# Patient Record
Sex: Male | Born: 1990 | Race: Black or African American | Hispanic: No | Marital: Single | Smoking: Never smoker
Health system: Southern US, Community
[De-identification: ages and names within clinical notes are randomized; demographics above are authoritative.]

## PROBLEM LIST (undated history)

## (undated) DIAGNOSIS — I10 Essential (primary) hypertension: Secondary | ICD-10-CM

---

## 2022-02-05 ENCOUNTER — Emergency Department (HOSPITAL_BASED_OUTPATIENT_CLINIC_OR_DEPARTMENT_OTHER)
Admission: EM | Admit: 2022-02-05 | Discharge: 2022-02-05 | Disposition: A | Discharge status: Home / Self Care | Payer: No Typology Code available for payment source | Attending: Emergency Medicine | Admitting: Emergency Medicine

## 2022-02-05 ENCOUNTER — Emergency Department (HOSPITAL_BASED_OUTPATIENT_CLINIC_OR_DEPARTMENT_OTHER): Payer: No Typology Code available for payment source

## 2022-02-05 ENCOUNTER — Other Ambulatory Visit: Payer: Self-pay

## 2022-02-05 ENCOUNTER — Encounter (HOSPITAL_BASED_OUTPATIENT_CLINIC_OR_DEPARTMENT_OTHER): Payer: Self-pay | Admitting: *Deleted

## 2022-02-05 DIAGNOSIS — R0602 Shortness of breath: Secondary | ICD-10-CM | POA: Insufficient documentation

## 2022-02-05 DIAGNOSIS — Z79899 Other long term (current) drug therapy: Secondary | ICD-10-CM | POA: Insufficient documentation

## 2022-02-05 DIAGNOSIS — R0789 Other chest pain: Secondary | ICD-10-CM | POA: Diagnosis not present

## 2022-02-05 DIAGNOSIS — R61 Generalized hyperhidrosis: Secondary | ICD-10-CM | POA: Diagnosis not present

## 2022-02-05 DIAGNOSIS — I10 Essential (primary) hypertension: Secondary | ICD-10-CM | POA: Diagnosis not present

## 2022-02-05 DIAGNOSIS — R001 Bradycardia, unspecified: Secondary | ICD-10-CM | POA: Insufficient documentation

## 2022-02-05 DIAGNOSIS — R079 Chest pain, unspecified: Secondary | ICD-10-CM

## 2022-02-05 HISTORY — DX: Essential (primary) hypertension: I10

## 2022-02-05 LAB — CBC
HCT: 39.5 % (ref 39.0–52.0)
Hemoglobin: 13.5 g/dL (ref 13.0–17.0)
MCH: 28.4 pg (ref 26.0–34.0)
MCHC: 34.2 g/dL (ref 30.0–36.0)
MCV: 83.2 fL (ref 80.0–100.0)
Platelets: 268 10*3/uL (ref 150–400)
RBC: 4.75 MIL/uL (ref 4.22–5.81)
RDW: 13.2 % (ref 11.5–15.5)
WBC: 4.5 10*3/uL (ref 4.0–10.5)
nRBC: 0 % (ref 0.0–0.2)

## 2022-02-05 LAB — RAPID URINE DRUG SCREEN, HOSP PERFORMED
Amphetamines: NOT DETECTED
Barbiturates: NOT DETECTED
Benzodiazepines: NOT DETECTED
Cocaine: NOT DETECTED
Opiates: NOT DETECTED
Tetrahydrocannabinol: POSITIVE — AB

## 2022-02-05 LAB — BASIC METABOLIC PANEL
Anion gap: 8 (ref 5–15)
BUN: 11 mg/dL (ref 6–20)
CO2: 23 mmol/L (ref 22–32)
Calcium: 9.5 mg/dL (ref 8.9–10.3)
Chloride: 107 mmol/L (ref 98–111)
Creatinine, Ser: 0.94 mg/dL (ref 0.61–1.24)
GFR, Estimated: >60 mL/min (ref >60)
Glucose, Bld: 94 mg/dL (ref 70–99)
Potassium: 3.8 mmol/L (ref 3.5–5.1)
Sodium: 138 mmol/L (ref 135–145)

## 2022-02-05 LAB — TROPONIN I (HIGH SENSITIVITY)
Troponin I (High Sensitivity): 3 ng/L (ref <18)
Troponin I (High Sensitivity): 2 ng/L (ref <18)

## 2022-02-05 MED ORDER — ASPIRIN 81 MG PO CHEW
324.0000 mg | CHEWABLE_TABLET | Freq: Once | ORAL | Status: AC
  Administered 2022-02-05: 324 mg via ORAL
  Filled 2022-02-05: qty 4

## 2022-02-05 NOTE — ED Provider Notes (Signed)
?[facility] EMERGENCY DEPARTMENT ?Provider Note ? ? ?CSN: [id number] ?Arrival date & time: 02/05/22  1341 ? ?  ? ?History ? ?Chief Complaint  ?Patient presents with  ? Chest Pain  ? ? ?Jacob Contreras is a 31 y.o. male past medical history of hypertension (not on any meds for same) who presents to the ED today with complaint of sudden onset, constant, substernal, tightness that began earlier today prior to 7 AM.  Patient also complains of diaphoresis and shortness of breath.  He reports that the pain wraps around his left shoulder and goes into his back.  He denies any nausea or vomiting.  He denies any cardiac history.  He does not vape.  He denies any significant family history of CAD.  Has not tried anything for pain.  ? ?The history is provided by the patient and medical records.  ? ?  ? ?Home Medications ?Prior to Admission medications   ?Not on File  ?   ? ?Allergies    ?Patient has no known allergies.   ? ?Review of Systems   ?Review of Systems  ?Constitutional:  Positive for diaphoresis. Negative for chills and fever.  ?Respiratory:  Positive for shortness of breath. Negative for cough.   ?Cardiovascular:  Positive for chest pain. Negative for palpitations and leg swelling.  ?Gastrointestinal:  Negative for nausea and vomiting.  ?All other systems reviewed and are negative. ? ?Physical Exam ?Updated Vital Signs ?BP 114/76   Pulse (!) 51   Temp 98.7 ?F (37.1 ?C) (Oral)   Resp 18   Ht 5\' 9"  (1.753 m)   Wt 87.1 kg   SpO2 97%   BMI 28.35 kg/m?  ?Physical Exam ?Vitals and nursing note reviewed.  ?Constitutional:   ?   Appearance: He is not ill-appearing or diaphoretic.  ?HENT:  ?   Head: Normocephalic and atraumatic.  ?Eyes:  ?   Conjunctiva/sclera: Conjunctivae normal.  ?Cardiovascular:  ?   Rate and Rhythm: Normal rate and regular rhythm.  ?   Pulses:     ?     Radial pulses are 2+ on the right side and 2+ on the left side.  ?     Posterior tibial pulses are 2+ on the right side and 2+ on the left  side.  ?   Heart sounds: Normal heart sounds.  ?Pulmonary:  ?   Effort: Pulmonary effort is normal.  ?   Breath sounds: Normal breath sounds. No decreased breath sounds, wheezing, rhonchi or rales.  ?Chest:  ?   Chest wall: Tenderness present.  ?Abdominal:  ?   Palpations: Abdomen is soft.  ?   Tenderness: There is no abdominal tenderness.  ?Musculoskeletal:  ?   Cervical back: Neck supple.  ?   Right lower leg: No edema.  ?   Left lower leg: No edema.  ?Skin: ?   General: Skin is warm and dry.  ?Neurological:  ?   Mental Status: He is alert.  ? ? ?ED Results / Procedures / Treatments   ?Labs ?(all labs ordered are listed, but only abnormal results are displayed) ?Labs Reviewed  ?RAPID URINE DRUG SCREEN, HOSP PERFORMED - Abnormal; Notable for the following components:  ?    Result Value  ? Tetrahydrocannabinol POSITIVE (*)   ? All other components within normal limits  ?BASIC METABOLIC PANEL  ?CBC  ?TROPONIN I (HIGH SENSITIVITY)  ?TROPONIN I (HIGH SENSITIVITY)  ? ? ?EKG ?EKG Interpretation ? ?Date/Time:  Friday February 05 2022 13:49:40  EDT ?Ventricular Rate:  53 ?PR Interval:  193 ?QRS Duration: 84 ?QT Interval:  401 ?QTC Calculation: 377 ?R Axis:   42 ?Text Interpretation: Sinus rhythm Confirmed by Nanda Quinton [phone]) on 02/05/2022 2:33:38 PM ? ?Radiology ?DG Chest 2 View ? ?Result Date: 02/05/2022 ?CLINICAL DATA:  Provided history: Chest pain. EXAM: CHEST - 2 VIEW COMPARISON:  None. FINDINGS: Heart size within normal limits. No appreciable airspace consolidation. No evidence of pleural effusion or pneumothorax. No acute bony abnormality identified. IMPRESSION: No evidence of active cardiopulmonary disease. Electronically Signed   By: Kellie Simmering D.O.   On: 02/05/2022 14:21   ? ?Procedures ?Procedures  ? ? ?Medications Ordered in ED ?Medications  ?aspirin chewable tablet 324 mg (324 mg Oral Given 02/05/22 1409)  ? ? ?ED Course/ Medical Decision Making/ A&P ?  ?                        ?Medical Decision  Making ?31 year old male who presents to the ED today with complaint of substernal chest tightness with radiation to back with associated diaphoresis and shortness of breath that began earlier this morning.  On arrival to the ED patient is bradycardic with heart rates in the 50s.  No documented history of same.  He does have a history of hypertension however is not on medications and states he has never been on medications for same.  Blood pressure 137/84 on arrival.  Remainder vitals are unremarkable. EKG without acute ischemic changes; no previous to compare to.  We will plan to work-up for ACS at this time with CBC, BMP, troponin, chest x-ray.  Patient has equal pulses bilaterally.  Very low suspicion for dissection.  He denies any URI-like symptoms prior to chest pain beginning to suggest pericarditis versus myocarditis.  Given he is nontachycardic and nonhypoxic I have very low suspicion for PE at this time.  We will add on UDS at this time to rule out cocaine use.  Aspirin provided. ? ?CXR clear ?Initial troponin of 3. Will plan to repeat ?CBC without leukocytosis. Hgb stable ?BMP without electrolyte abnormalities ?UDS positive for THC ? ?Repeat troponin downtrending at 2. Very low suspicion for ACS given downtrending troponins that are very close to negative. On repeat exam pt resting comfortably. He has continued to be bradycardic in the ED today. Does not appear symptomatic from same. He is unsure of hx of same - unable to see previous heart rates in our system. He is advised to follow up with the [facility] in [place]. Pt in agreement with plan and stable for discharge home.  ? ?Amount and/or Complexity of Data Reviewed ?Labs: ordered. Decision-making details documented in ED Course. ?Radiology: ordered. ? ?Risk ?OTC drugs. ? ? ? ? ? ? ? ? ? ?Final Clinical Impression(s) / ED Diagnoses ?Final diagnoses:  ?Nonspecific chest pain  ?Bradycardia  ? ? ?Rx / DC Orders ?ED Discharge Orders   ? ? None  ? ?   ? ? ? ?Discharge Instructions   ? ?  ?Your workup was overall reassuring in the ED today. Please follow up with the [facility] regarding your ED visit today and your low resting heart rate.  ? ?It is recommended that you take Ibuprofen and Tylenol as needed for pain.  ? ?Return to the ED for any new/worsening symptoms ? ? ? ? ? ?  ?Eustaquio Maize, PA-C ?02/05/22 1659 ? ?  ?Margette Fast, MD ?02/18/22 1116 ? ?

## 2022-02-05 NOTE — ED Notes (Signed)
ED Provider at bedside. 

## 2022-02-05 NOTE — ED Notes (Signed)
Patient transported to X-ray 

## 2022-02-05 NOTE — ED Triage Notes (Signed)
Pt reports he woke up with chest pain this am around 7am. Pt states chest is "tight" and he got sweaty when it started, and it goes to his back ?

## 2022-02-05 NOTE — Discharge Instructions (Signed)
Your workup was overall reassuring in the ED today. [facility] and your low resting heart rate.  ? ?It is recommended that you take Ibuprofen and Tylenol as needed for pain.  ? ?Return to the ED for any new/worsening symptoms ?

## 2022-12-17 ENCOUNTER — Encounter (HOSPITAL_COMMUNITY): Payer: Self-pay

## 2022-12-17 ENCOUNTER — Emergency Department (HOSPITAL_COMMUNITY)
Admission: EM | Admit: 2022-12-17 | Discharge: 2022-12-18 | Disposition: A | Discharge status: Other Acute Inpatient Hospital | Payer: Non-veteran care | Attending: Emergency Medicine | Admitting: Emergency Medicine

## 2022-12-17 ENCOUNTER — Other Ambulatory Visit: Payer: Self-pay

## 2022-12-17 DIAGNOSIS — I10 Essential (primary) hypertension: Secondary | ICD-10-CM | POA: Insufficient documentation

## 2022-12-17 DIAGNOSIS — T1491XA Suicide attempt, initial encounter: Secondary | ICD-10-CM

## 2022-12-17 DIAGNOSIS — Z20822 Contact with and (suspected) exposure to covid-19: Secondary | ICD-10-CM | POA: Diagnosis not present

## 2022-12-17 DIAGNOSIS — F4321 Adjustment disorder with depressed mood: Secondary | ICD-10-CM | POA: Insufficient documentation

## 2022-12-17 DIAGNOSIS — T50901A Poisoning by unspecified drugs, medicaments and biological substances, accidental (unintentional), initial encounter: Secondary | ICD-10-CM | POA: Insufficient documentation

## 2022-12-17 DIAGNOSIS — X838XXA Intentional self-harm by other specified means, initial encounter: Secondary | ICD-10-CM | POA: Diagnosis not present

## 2022-12-17 DIAGNOSIS — T43592A Poisoning by other antipsychotics and neuroleptics, intentional self-harm, initial encounter: Secondary | ICD-10-CM | POA: Insufficient documentation

## 2022-12-17 DIAGNOSIS — T50902A Poisoning by unspecified drugs, medicaments and biological substances, intentional self-harm, initial encounter: Secondary | ICD-10-CM

## 2022-12-17 LAB — CBC WITH DIFFERENTIAL/PLATELET
Abs Immature Granulocytes: 0.02 10*3/uL (ref 0.00–0.07)
Basophils Absolute: 0 10*3/uL (ref 0.0–0.1)
Basophils Relative: 0 %
Eosinophils Absolute: 0.1 10*3/uL (ref 0.0–0.5)
Eosinophils Relative: 1 %
HCT: 42.7 % (ref 39.0–52.0)
Hemoglobin: 13.8 g/dL (ref 13.0–17.0)
Immature Granulocytes: 0 %
Lymphocytes Relative: 21 %
Lymphs Abs: 1.6 10*3/uL (ref 0.7–4.0)
MCH: 28 pg (ref 26.0–34.0)
MCHC: 32.3 g/dL (ref 30.0–36.0)
MCV: 86.8 fL (ref 80.0–100.0)
Monocytes Absolute: 0.6 10*3/uL (ref 0.1–1.0)
Monocytes Relative: 8 %
Neutro Abs: 5.1 10*3/uL (ref 1.7–7.7)
Neutrophils Relative %: 70 %
Platelets: 316 10*3/uL (ref 150–400)
RBC: 4.92 MIL/uL (ref 4.22–5.81)
RDW: 13.2 % (ref 11.5–15.5)
WBC: 7.4 10*3/uL (ref 4.0–10.5)
nRBC: 0 % (ref 0.0–0.2)

## 2022-12-17 LAB — COMPREHENSIVE METABOLIC PANEL
ALT: 37 U/L (ref 0–44)
AST: 25 U/L (ref 15–41)
Albumin: 4.3 g/dL (ref 3.5–5.0)
Alkaline Phosphatase: 48 U/L (ref 38–126)
Anion gap: 9 (ref 5–15)
BUN: 9 mg/dL (ref 6–20)
CO2: 23 mmol/L (ref 22–32)
Calcium: 9.4 mg/dL (ref 8.9–10.3)
Chloride: 105 mmol/L (ref 98–111)
Creatinine, Ser: 0.98 mg/dL (ref 0.61–1.24)
GFR, Estimated: >60 mL/min (ref >60)
Glucose, Bld: 107 mg/dL — ABNORMAL HIGH (ref 70–99)
Potassium: 4.6 mmol/L (ref 3.5–5.1)
Sodium: 137 mmol/L (ref 135–145)
Total Bilirubin: 0.6 mg/dL (ref 0.3–1.2)
Total Protein: 7.6 g/dL (ref 6.5–8.1)

## 2022-12-17 LAB — RAPID URINE DRUG SCREEN, HOSP PERFORMED
Amphetamines: NOT DETECTED
Barbiturates: NOT DETECTED
Benzodiazepines: NOT DETECTED
Cocaine: NOT DETECTED
Opiates: NOT DETECTED
Tetrahydrocannabinol: NOT DETECTED

## 2022-12-17 LAB — RESP PANEL BY RT-PCR (RSV, FLU A&B, COVID)  RVPGX2
Influenza A by PCR: NEGATIVE
Influenza B by PCR: NEGATIVE
Resp Syncytial Virus by PCR: NEGATIVE
SARS Coronavirus 2 by RT PCR: NEGATIVE

## 2022-12-17 LAB — ETHANOL: Alcohol, Ethyl (B): <10 mg/dL (ref <10)

## 2022-12-17 LAB — ACETAMINOPHEN LEVEL: Acetaminophen (Tylenol), Serum: <10 ug/mL — ABNORMAL LOW (ref 10–30)

## 2022-12-17 LAB — SALICYLATE LEVEL: Salicylate Lvl: <7 mg/dL — ABNORMAL LOW (ref 7.0–30.0)

## 2022-12-17 MED ORDER — SODIUM CHLORIDE 0.9 % IV BOLUS (SEPSIS)
1000.0000 mL | Freq: Once | INTRAVENOUS | Status: AC
Start: 2022-12-17 — End: 2022-12-17
  Administered 2022-12-17: 1000 mL via INTRAVENOUS

## 2022-12-17 MED ORDER — SODIUM CHLORIDE 0.9 % IV SOLN
1000.0000 mL | INTRAVENOUS | Status: DC
  Administered 2022-12-17: 1000 mL via INTRAVENOUS

## 2022-12-17 MED ORDER — HYDROXYZINE HCL 25 MG PO TABS: 25.0000 mg | ORAL_TABLET | Freq: Three times a day (TID) | ORAL | Status: DC | PRN

## 2022-12-17 NOTE — Progress Notes (Signed)
Patient has been denied by [facility] due to no appropriate beds available. Patient meets [facility] inpatient criteria per Michaele Offer, NP. Patient has been faxed out to the following facilities:   [facility]  [address], [place] [address] [phone] [place]  [address] [place] [address] [phone]  [facility]  [address], [place] [address] [phone]  [facility]  [address] [place] [address] [phone]  [facility]  [place], [place] [address] [phone]  [facility]  [address] [place], [place] [address] [place] [place] [address] [place]  [facility]  [address] [place] [address] [phone]  [facility]  [address], [place]  [address] [phone]  [place] [place] [address] [place]  [facility]  [address] [place] [address] [phone] [place], MSW, LCSW-A  10:01 PM 12/17/2022

## 2022-12-17 NOTE — ED Notes (Signed)
Spoke with Patty from poison control at this time about patient's case.

## 2022-12-17 NOTE — ED Provider Notes (Signed)
[name] Provider Note  CSN: [id number] Arrival date & time: 12/17/22 [id number]  Chief Complaint(s) Drug Overdose ED Triage Notes Jacob Starch, RN (Registered Nurse)  Emergency Medicine  Date of Service: 12/17/2022  2:28 AM  Signed [name], reports patient taking 110 mg of zyprexa at 4pm, normally takes '5mg'$  daily. Per Contreras, girlfriend states he had been okay until recently. Patient is drowsy but does respond to verbal commands.     HPI Jacob Contreras is a 32 y.o. male here after overdosing on estimated '110mg'$  of olanzapine. Possible suicide attempt.   The history is provided by the Contreras personnel, a friend and a significant other.    Past Medical History Past Medical History:  Diagnosis Date   Hypertension    There are no problems to display for this patient.  Home Medication(s) Prior to Admission medications   Not on File                                                                                                                                    Allergies Patient has no known allergies.  Review of Systems Review of Systems As noted in HPI  Physical Exam Vital Signs  I have reviewed the triage vital signs BP 118/69   Pulse 83   Temp 98 F (36.7 C) (Oral)   Resp 15   SpO2 97%   Physical Exam Vitals reviewed.  Constitutional:      General: He is not in acute distress.    Appearance: He is well-developed. He is not diaphoretic.  HENT:     Head: Normocephalic and atraumatic.     Nose: Nose normal.  Eyes:     General: No scleral icterus.       Right eye: No discharge.        Left eye: No discharge.     Conjunctiva/sclera: Conjunctivae normal.     Pupils: Pupils are equal, round, and reactive to light.     Comments: Pin point pupils  Cardiovascular:     Rate and Rhythm: Normal rate and regular rhythm.     Heart sounds: No murmur heard.    No friction rub. No gallop.  Pulmonary:      Effort: Pulmonary effort is normal. No respiratory distress.     Breath sounds: Normal breath sounds. No stridor. No rales.  Abdominal:     General: There is no distension.     Palpations: Abdomen is soft.     Tenderness: There is no abdominal tenderness.  Musculoskeletal:        General: No tenderness.     Cervical back: Normal range of motion and neck supple.  Skin:    General: Skin is warm and dry.     Findings: No erythema or rash.  Neurological:     Mental Status: He is lethargic.  ED Results and Treatments Labs (all labs ordered are listed, but only abnormal results are displayed) Labs Reviewed  COMPREHENSIVE METABOLIC PANEL - Abnormal; Notable for the following components:      Result Value   Glucose, Bld 107 (*)    All other components within normal limits  SALICYLATE LEVEL - Abnormal; Notable for the following components:   Salicylate Lvl [id number] (*)    All other components within normal limits  ACETAMINOPHEN LEVEL - Abnormal; Notable for the following components:   Acetaminophen (Tylenol), Serum <10 (*)    All other components within normal limits  ETHANOL  RAPID URINE DRUG SCREEN, HOSP PERFORMED  CBC WITH DIFFERENTIAL/PLATELET                                                                                                                         EKG  EKG Interpretation  Date/Time:  Friday December 17 2022 06:08:23 EST Ventricular Rate:  61 PR Interval:  190 QRS Duration: 82 QT Interval:  377 QTC Calculation: 380 R Axis:   58 Text Interpretation: Sinus rhythm Consider left atrial enlargement Borderline ST elevation, anterior leads No acute changes Confirmed by Jacob Contreras [phone]) on 12/17/2022 6:33:20 AM       Radiology No results found.  Medications Ordered in ED Medications  sodium chloride 0.9 % bolus 1,000 mL (0 mLs Intravenous Stopped 12/17/22 0352)    Followed by  0.9 %  sodium chloride infusion (1,000 mLs Intravenous New Bag/Given 12/17/22 0548)                                                                                                                                      Procedures Procedures  (including critical care time)  Medical Decision Making / ED Course  Click here for ABCD2, HEART and other calculators  Medical Decision Making Amount and/or Complexity of Data Reviewed Labs: ordered. Decision-making details documented in ED Course. ECG/medicine tests: ordered and independent interpretation performed. Decision-making details documented in ED Course.  Risk Prescription drug management. Decision regarding hospitalization.     Clinical Course as of 12/17/22 0733  Fri Dec 17, 2022  0230 Somnolent. Possible overdose. OD labs ordered. Will monitor. [PC]  [id number] CBC without leukocytosis or anemia.  Metabolic panel without significant electrolyte derangements or renal sufficiency.  EtOH negative.  Acetaminophen and salicylate levels negative.  UDS negative. [PC]  [id number] Patient's significant  other came to the emergency department and stated that the patient's was prescribed 20 mg olanzapine.  Reports that the bottle was written for 15 tablets and there were 8 left.  He was supposed to take half a tablet twice a day.  States that he received a medication sometime last week but is not sure if he was taking it on a daily basis.  When she asked patient how many pills she had took earlier, he repeatedly said " two pills." [PC]  0600 Spoke with poison control. Once patient is awake he would be clear for DC after reassuring repeat EKG.  [PC]  [id number] EKG unchanged Patient still sleepy, but arousable. Will continue to monitor [PC]  0704 Received sign out from Dr. Leonette Monarch, presenting with Zyprexa overdose, persistently sleepy. Not reportedly a suicide attempt but will re-assess [WS]    Clinical Course User Index [PC] Jacob Contreras, Jacob Sessions, MD [WS] Jacob Mainland Livingston Diones, MD   Patient care turned over to oncoming provider. Patient case and  results discussed in detail; please see their note for further ED managment.      Final Clinical Impression(s) / ED Diagnoses Final diagnoses:  None    {Document critical care time when appropriate:1}  {Document review of labs and clinical decision tools ie heart score, Chads2Vasc2 etc:1}  {Document your independent review of radiology images, and any outside records:1} {Document your discussion with family members, caretakers, and with consultants:1} {Document social determinants of health affecting pt's care:1} {Document your decision making why or why not admission, treatments were needed:1} This chart was dictated using voice recognition software.  Despite best efforts to proofread,  errors can occur which can change the documentation meaning.

## 2022-12-17 NOTE — ED Notes (Addendum)
Pt belonging were  behind the nurses station in [place] they have been placed in locker 36.

## 2022-12-17 NOTE — ED Notes (Signed)
Spoke with Lattie Haw at [employer]. Repeat EKG results given and updated vitals as well. Lattie Haw states that [employer] will close his case out on their end. If we need anything else regarding a change in this pt to please call back.

## 2022-12-17 NOTE — Consult Note (Signed)
[facility] ED ASSESSMENT   Reason for Consult:  overdose, SI Referring Physician:  Dr. Truett Mainland  Patient Identification: Jacob Contreras Age MRN:  [id number] ED Chief Complaint: Suicide attempt [facility])  Diagnosis:  Principal Problem:   Suicide attempt [facility]) Active Problems:   Grief   Overdose   ED Assessment Time Calculation: Start Time: 1130 Stop Time: 1210 Total Time in Minutes (Assessment Completion): 40   HPI:  Per Triage Note Patient brought in by [facility], reports patient taking 110 mg of zyprexa at 4pm, normally takes '5mg'$  daily. Per ems, girlfriend states he had been okay until recently. Patient is drowsy but does respond to verbal commands.    Subjective:  Jacob Contreras, 32 y.o., male patient seen face to face by this provider, consulted with Dr. Leverne Humbles; and chart reviewed on 12/17/22.  On evaluation Jacob Contreras reports that this attempt was a suicidal attempt due to a lot going on in his life he says, says he lost his grandmother about 3 weeks ago she passed away, says that he is living with his girlfriend and things are not going well with him, and he said he has no job.  Patient states that his appetite is fair, and his sleep is poor due to racing thoughts and anxiety.  He says that he does not have a lot of family support right now, because everyone is grieving for his grandmother in their own way, says he is very close with his 2 brothers but has not spoken to them in about 2 to 3 weeks since her grandmother passed.  Patient says that he does have a therapist, Dr. Redmond Pulling that he sees but has not spoken to his therapist since his grandmother passed, and he just felt like life has been too hard lately.  Patient currently denies SI/HI/AVH.  Patient states that he has had previous attempts of SI, said that he was in a psychiatric facility in 2023.  Patient states that he takes Vistaril, Zyprexa, and unsure of sleep medication that he takes.  Patient states he has a diagnosis of bipolar disorder,  his UDS and alcohol level was negative.  During evaluation Jacob Contreras is laying in hospital bed in no acute distress.  Patient is alert but drowsy as he is just waking up for the day, oriented x 3, calm, cooperative and attentive.  His mood is depressed with congruent/guarded affect.  He has mumbling speech, decreased volume, and behavior. Objectively there is no evidence of psychosis/mania or delusional thinking. Patient is able to converse coherently, no distractibility, or pre-occupation.  He currently denies suicidal/self-harm/homicidal ideation, psychosis, and paranoia.      Past Psychiatric History: Bipolar disorder  Risk to Self or Others: Is the patient at risk to self? Yes Has the patient been a risk to self in the past 6 months? No Has the patient been a risk to self within the distant past? No Is the patient a risk to others? No Has the patient been a risk to others in the past 6 months? No Has the patient been a risk to others within the distant past? No  [place] Scale:   AIMS:  , , ,  ,   ASAM:    Substance Abuse:     Past Medical History:  Past Medical History:  Diagnosis Date   Hypertension    History reviewed. No pertinent surgical history. Family History: History reviewed. No pertinent family history.  Social History:  Social History   Substance and Sexual Activity  Alcohol  Use Never     Social History   Substance and Sexual Activity  Drug Use Never    Social History   Socioeconomic History   Marital status: Single    Spouse name: Not on file   Number of children: Not on file   Years of education: Not on file   Highest education level: Not on file  Occupational History   Not on file  Tobacco Use   Smoking status: Every Day    Types: Cigarettes   Smokeless tobacco: Never  Substance and Sexual Activity   Alcohol use: Never   Drug use: Never   Sexual activity: Not on file  Other Topics Concern   Not on file  Social History Narrative    Not on file   Social Determinants of Health   Financial Resource Strain: Not on file  Food Insecurity: Not on file  Transportation Needs: Not on file  Physical Activity: Not on file  Stress: Not on file  Social Connections: Not on file   Additional Social History:    Allergies:  No Known Allergies  Labs:  Results for orders placed or performed during the hospital encounter of 12/17/22 (from the past 48 hour(s))  Rapid urine drug screen (hospital performed)     Status: None   Collection Time: 12/17/22  2:20 AM  Result Value Ref Range   Opiates NONE DETECTED NONE DETECTED   Cocaine NONE DETECTED NONE DETECTED   Benzodiazepines NONE DETECTED NONE DETECTED   Amphetamines NONE DETECTED NONE DETECTED   Tetrahydrocannabinol NONE DETECTED NONE DETECTED   Barbiturates NONE DETECTED NONE DETECTED    Comment: (NOTE) DRUG SCREEN FOR MEDICAL PURPOSES ONLY.  IF CONFIRMATION IS NEEDED FOR ANY PURPOSE, NOTIFY LAB WITHIN 5 DAYS.  LOWEST DETECTABLE LIMITS FOR URINE DRUG SCREEN Drug Class                     Cutoff (ng/mL) Amphetamine and metabolites    1000 Barbiturate and metabolites    200 Benzodiazepine                 200 Opiates and metabolites        300 Cocaine and metabolites        300 THC                            50 Performed at [facility], [place] [address], [place], [place] [address]   Comprehensive metabolic panel     Status: Abnormal   Collection Time: 12/17/22  2:38 AM  Result Value Ref Range   Sodium 137 135 - 145 mmol/L   Potassium 4.6 3.5 - 5.1 mmol/L   Chloride 105 98 - 111 mmol/L   CO2 23 22 - 32 mmol/L   Glucose, Bld 107 (H) 70 - 99 mg/dL    Comment: Glucose reference range applies only to samples taken after fasting for at least 8 hours.   BUN 9 6 - 20 mg/dL   Creatinine, Ser 0.98 0.61 - 1.24 mg/dL   Calcium 9.4 8.9 - 10.3 mg/dL   Total Protein 7.6 6.5 - 8.1 g/dL   Albumin 4.3 3.5 - 5.0 g/dL   AST 25 15 - 41 U/L   ALT 37 0 - 44 U/L    Alkaline Phosphatase 48 38 - 126 U/L   Total Bilirubin 0.6 0.3 - 1.2 mg/dL   GFR, Estimated >60 >60 mL/min  Comment: (NOTE) Calculated using the CKD-EPI Creatinine Equation (2021)    Anion gap 9 5 - 15    Comment: Performed at [facility], [place] [address] [place], [place] [address]  Ethanol     Status: None   Collection Time: 12/17/22  2:38 AM  Result Value Ref Range   Alcohol, Ethyl (B) <10 <10 mg/dL    Comment: (NOTE) Lowest detectable limit for serum alcohol is 10 mg/dL.  For medical purposes only. Performed at [facility], [place] [address], [place], [place] [id number]   Salicylate level     Status: Abnormal   Collection Time: 12/17/22  2:38 AM  Result Value Ref Range   Salicylate Lvl [id number] (L) 7.0 - 30.0 mg/dL    Comment: Performed at [facility], [place] [address], [place], [place] [address]  Acetaminophen level     Status: Abnormal   Collection Time: 12/17/22  2:38 AM  Result Value Ref Range   Acetaminophen (Tylenol), Serum <10 (L) 10 - 30 ug/mL    Comment: (NOTE) Therapeutic concentrations vary significantly. A range of 10-30 ug/mL  may be an effective concentration for many patients. However, some  are best treated at concentrations outside of this range. Acetaminophen concentrations >150 ug/mL at 4 hours after ingestion  and >50 ug/mL at 12 hours after ingestion are often associated with  toxic reactions.  Performed at [facility], [place] [address] [place], [place] [address]   CBC with Differential     Status: None   Collection Time: 12/17/22  2:39 AM  Result Value Ref Range   WBC 7.4 4.0 - 10.5 K/uL   RBC 4.92 4.22 - 5.81 MIL/uL   Hemoglobin 13.8 13.0 - 17.0 g/dL   HCT 42.7 39.0 - 52.0 %   MCV 86.8 80.0 - 100.0 fL   MCH 28.0 26.0 - 34.0 pg   MCHC 32.3 30.0 - 36.0 g/dL   RDW 13.2 11.5 - 15.5 %   Platelets 316 150 - 400 K/uL   nRBC 0.0 0.0 - 0.2 %   Neutrophils Relative % 70 %    Neutro Abs 5.1 1.7 - 7.7 K/uL   Lymphocytes Relative 21 %   Lymphs Abs 1.6 0.7 - 4.0 K/uL   Monocytes Relative 8 %   Monocytes Absolute 0.6 0.1 - 1.0 K/uL   Eosinophils Relative 1 %   Eosinophils Absolute 0.1 0.0 - 0.5 K/uL   Basophils Relative 0 %   Basophils Absolute 0.0 0.0 - 0.1 K/uL   Immature Granulocytes 0 %   Abs Immature Granulocytes 0.02 0.00 - 0.07 K/uL    Comment: Performed at [facility], [place] [address], [place], [place] [address]    Current Facility-Administered Medications  Medication Dose Route Frequency Provider Last Rate Last Admin   0.9 %  sodium chloride infusion  1,000 mL Intravenous Continuous Cardama, Grayce Sessions, MD 125 mL/hr at 12/17/22 0548 1,000 mL at 12/17/22 0548   Current Outpatient Medications  Medication Sig Dispense Refill   hydrOXYzine (VISTARIL) 25 MG capsule Take 25 mg by mouth 2 (two) times daily as needed for anxiety.     OLANZapine (ZYPREXA) 20 MG tablet Take 10 mg by mouth at bedtime.      Musculoskeletal: Strength & Muscle Tone: within normal limits Gait & Station: normal Patient leans: N/A   Psychiatric Specialty Exam: Presentation  General Appearance:  Appropriate for Environment  Eye Contact: Fleeting  Speech: Clear and Coherent  Speech  Volume: Decreased  Handedness: Left   Mood and Affect  Mood: Depressed  Affect: Appropriate; Flat   Thought Process  Thought Processes: Coherent  Descriptions of Associations:Intact  Orientation:Full (Time, Place and Person)  Thought Content:WDL  History of Schizophrenia/Schizoaffective disorder:No data recorded Duration of Psychotic Symptoms:No data recorded Hallucinations:Hallucinations: None  Ideas of Reference:None  Suicidal Thoughts:Suicidal Thoughts: No  Homicidal Thoughts:Homicidal Thoughts: No   Sensorium  Memory: Immediate Fair; Recent Fair  Judgment: Poor  Insight: Fair   Manufacturing systems engineer: Fair  Attention Span: Fair  Recall: [place] of Knowledge: Good  Language: Good   Psychomotor Activity  Psychomotor Activity: Psychomotor Activity: Normal   Assets  Assets: Communication Skills; Physical Health; Social Support    Sleep  Sleep: Sleep: Poor   Physical Exam: Physical Exam HENT:     Head: Normocephalic.  Eyes:     Pupils: Pupils are equal, round, and reactive to light.  Abdominal:     General: Abdomen is flat.  Musculoskeletal:        General: Normal range of motion.  Neurological:     Mental Status: He is alert.  Psychiatric:        Attention and Perception: Attention normal.        Mood and Affect: Mood is depressed. Affect is flat.        Speech: Speech is delayed.        Behavior: Behavior is cooperative.        Thought Content: Thought content includes suicidal ideation.        Cognition and Memory: Memory normal.        Judgment: Judgment is impulsive and inappropriate.    Review of Systems  Constitutional: Negative.   HENT: Negative.    Respiratory: Negative.    Musculoskeletal: Negative.   Psychiatric/Behavioral:  Positive for depression and suicidal ideas.    Blood pressure 130/82, pulse (!) 59, temperature 98.2 F (36.8 C), resp. rate 15, SpO2 100 %. There is no height or weight on file to calculate BMI.  Medical Decision Making: Patient will be considered for inpatient Psychiatry care when Medically cleared.  Attempted to speak with girlfriend Rowe Pavy to obtain collateral information, patient unable to remember her phone number. Patient is IVC at this time. Will order Atarax for anxiety. Will consult with SW team for bed availability.     Disposition: Recommend psychiatric Inpatient admission when medically cleared.  Michaele Offer, PMHNP 12/17/2022 12:40 PM

## 2022-12-17 NOTE — ED Provider Notes (Addendum)
    Procedures  .Critical Care  Performed by: Cristie Hem, MD Authorized by: Cristie Hem, MD   Critical care provider statement:    Critical care time (minutes):  30   Critical care time was exclusive of:  Separately billable procedures and treating other patients   Critical care was necessary to treat or prevent imminent or life-threatening deterioration of the following conditions:  Toxidrome   Critical care was time spent personally by me on the following activities:  Development of treatment plan with patient or surrogate, discussions with consultants, evaluation of patient's response to treatment, examination of patient, ordering and review of laboratory studies, ordering and review of radiographic studies, ordering and performing treatments and interventions, pulse oximetry, re-evaluation of patient's condition and review of old charts   ED Course / MDM   Clinical Course as of 12/17/22 1409  Fri Dec 17, 2022  0230 Somnolent. Possible overdose. OD labs ordered. Will monitor. [PC]  [id number] CBC without leukocytosis or anemia.  Metabolic panel without significant electrolyte derangements or renal sufficiency.  EtOH negative.  Acetaminophen and salicylate levels negative.  UDS negative. [PC]  [id number] Patient's significant other came to the emergency department and stated that the patient's was prescribed 20 mg olanzapine.  Reports that the bottle was written for 15 tablets and there were 8 left.  He was supposed to take half a tablet twice a day.  States that he received a medication sometime last week but is not sure if he was taking it on a daily basis.  When she asked patient how many pills she had took earlier, he repeatedly said " two pills." [PC]  0600 Spoke with poison control. Once patient is awake he would be clear for DC after reassuring repeat EKG.  [PC]  [id number] EKG unchanged Patient still sleepy, but arousable. Will continue to monitor [PC]  0704 Received sign out from  Dr. Leonette Monarch, presenting with Zyprexa overdose, persistently sleepy. Not reportedly a suicide attempt but will re-assess [WS]  0906 Patient arousable. Reports he did overdose on Zyprexa as a self harm attempt. Denies active suicidal ideation and willing to speak with psychiatry. Will have sitter at bedside. Will consult TTS [WS]  1233 Psychiatry requesting IVC given suicide attempt. Will place IVC. Patient will be referred for inpatient treatment [WS]  1408 Patient medically cleared per my evaluation and previous discussion with poison control.  [WS]    Clinical Course User Index [PC] Cardama, Grayce Sessions, MD [WS] Truett Mainland Livingston Diones, MD   Medical Decision Making Amount and/or Complexity of Data Reviewed Labs: ordered.  Risk Prescription drug management.         Cristie Hem, MD 12/17/22 1149    Cristie Hem, MD 12/17/22 1209    Cristie Hem, MD 12/17/22 1234    Cristie Hem, MD 12/17/22 1409

## 2022-12-17 NOTE — ED Notes (Signed)
Patient was dress out in burgundy scrubs and grey socks.  Patient was wanded by security.  Patient had 1 black book bag and a multi color bag as while as one patient belonging bag.  I left  patient  belongings with the paramedic in sapu.

## 2022-12-17 NOTE — ED Notes (Signed)
Pt given dinner tray.

## 2022-12-17 NOTE — ED Notes (Signed)
Patient awoke to need to use bathroom for BM. Patient taken to bathroom and back to bed.

## 2022-12-17 NOTE — ED Triage Notes (Signed)
Patient brought in by [facility], reports patient taking 110 mg of zyprexa at 4pm, normally takes '5mg'$  daily. Per ems, girlfriend states he had been okay until recently. Patient is drowsy but does respond to verbal commands.

## 2022-12-17 NOTE — ED Notes (Signed)
Pt provided urinal for use. Pt reports to MD that he overdosed in attempt to harm himself. TTS consult placed by MD

## 2022-12-17 NOTE — ED Notes (Signed)
Patient in room talking to psych provider at this time. Sitter order placed.

## 2022-12-17 NOTE — ED Notes (Signed)
2 patient belonging bookbags in cabinet at nurses station.

## 2022-12-17 NOTE — ED Notes (Signed)
Mother called requesting updates, explained that I can't provide information without his consent. Mother asked patient to call her. Patient made aware

## 2022-12-18 ENCOUNTER — Inpatient Hospital Stay (HOSPITAL_COMMUNITY)
Admission: EM | Admit: 2022-12-18 | Discharge: 2022-12-23 | DRG: 885 | Disposition: A | Discharge status: Home / Self Care | Payer: No Typology Code available for payment source | Source: Intra-hospital | Attending: Psychiatry | Admitting: Psychiatry

## 2022-12-18 DIAGNOSIS — F411 Generalized anxiety disorder: Secondary | ICD-10-CM | POA: Diagnosis present

## 2022-12-18 DIAGNOSIS — Z9151 Personal history of suicidal behavior: Secondary | ICD-10-CM | POA: Diagnosis not present

## 2022-12-18 DIAGNOSIS — Z79899 Other long term (current) drug therapy: Secondary | ICD-10-CM

## 2022-12-18 DIAGNOSIS — K30 Functional dyspepsia: Secondary | ICD-10-CM | POA: Diagnosis present

## 2022-12-18 DIAGNOSIS — T50902A Poisoning by unspecified drugs, medicaments and biological substances, intentional self-harm, initial encounter: Principal | ICD-10-CM | POA: Diagnosis present

## 2022-12-18 DIAGNOSIS — T43592A Poisoning by other antipsychotics and neuroleptics, intentional self-harm, initial encounter: Secondary | ICD-10-CM | POA: Diagnosis present

## 2022-12-18 DIAGNOSIS — I10 Essential (primary) hypertension: Secondary | ICD-10-CM | POA: Diagnosis present

## 2022-12-18 DIAGNOSIS — Z833 Family history of diabetes mellitus: Secondary | ICD-10-CM

## 2022-12-18 DIAGNOSIS — K5900 Constipation, unspecified: Secondary | ICD-10-CM | POA: Diagnosis present

## 2022-12-18 DIAGNOSIS — G4700 Insomnia, unspecified: Secondary | ICD-10-CM | POA: Diagnosis present

## 2022-12-18 DIAGNOSIS — Z818 Family history of other mental and behavioral disorders: Secondary | ICD-10-CM | POA: Diagnosis not present

## 2022-12-18 DIAGNOSIS — F3181 Bipolar II disorder: Principal | ICD-10-CM | POA: Diagnosis present

## 2022-12-18 DIAGNOSIS — Z5986 Financial insecurity: Secondary | ICD-10-CM

## 2022-12-18 DIAGNOSIS — F4321 Adjustment disorder with depressed mood: Secondary | ICD-10-CM | POA: Diagnosis present

## 2022-12-18 MED ORDER — HYDROXYZINE HCL 25 MG PO TABS: 25.0000 mg | ORAL_TABLET | Freq: Three times a day (TID) | ORAL | Status: DC | PRN

## 2022-12-18 MED ORDER — HALOPERIDOL LACTATE 5 MG/ML IJ SOLN: 5.0000 mg | Freq: Three times a day (TID) | INTRAMUSCULAR | Status: DC | PRN

## 2022-12-18 MED ORDER — LORAZEPAM 2 MG/ML IJ SOLN: 2.0000 mg | Freq: Three times a day (TID) | INTRAMUSCULAR | Status: DC | PRN

## 2022-12-18 MED ORDER — LORAZEPAM 1 MG PO TABS: 2.0000 mg | ORAL_TABLET | Freq: Three times a day (TID) | ORAL | Status: DC | PRN

## 2022-12-18 MED ORDER — ACETAMINOPHEN 325 MG PO TABS: 650.0000 mg | ORAL_TABLET | Freq: Four times a day (QID) | ORAL | Status: DC | PRN

## 2022-12-18 MED ORDER — MAGNESIUM HYDROXIDE 400 MG/5ML PO SUSP: 30.0000 mL | Freq: Every day | ORAL | Status: DC | PRN

## 2022-12-18 MED ORDER — DIPHENHYDRAMINE HCL 50 MG/ML IJ SOLN: 50.0000 mg | Freq: Three times a day (TID) | INTRAMUSCULAR | Status: DC | PRN

## 2022-12-18 MED ORDER — MIRTAZAPINE 7.5 MG PO TABS
7.5000 mg | ORAL_TABLET | Freq: Every day | ORAL | Status: DC
  Administered 2022-12-18 – 2022-12-22 (×5): 7.5 mg via ORAL
  Filled 2022-12-18 (×11): qty 1

## 2022-12-18 MED ORDER — DIPHENHYDRAMINE HCL 25 MG PO CAPS: 50.0000 mg | ORAL_CAPSULE | Freq: Three times a day (TID) | ORAL | Status: DC | PRN

## 2022-12-18 MED ORDER — ALUM & MAG HYDROXIDE-SIMETH 200-200-20 MG/5ML PO SUSP: 30.0000 mL | ORAL | Status: DC | PRN

## 2022-12-18 MED ORDER — HALOPERIDOL 5 MG PO TABS: 5.0000 mg | ORAL_TABLET | Freq: Three times a day (TID) | ORAL | Status: DC | PRN

## 2022-12-18 MED ORDER — MIRTAZAPINE 7.5 MG PO TABS
7.5000 mg | ORAL_TABLET | Freq: Every day | ORAL | Status: DC
  Filled 2022-12-18: qty 1
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
## 2022-12-18 NOTE — Progress Notes (Signed)
[facility] LCSW Progress Note   12/18/2022    11:23 AM  Tristan Schroeder   [id number]   Type of Contact and Topic:  Psychiatric Bed Placement   Pt accepted to [facility] 403-2   Patient meets inpatient criteria per Michaele Offer, NP    The attending provider will be Dr. Caswell Corwin  Call report to [phone]    Marzetta Merino, Paramedic ED notified.     Pt scheduled  to arrive at [place] at 1230.    Mariea Clonts, MSW, LCSW-A  11:24 AM 12/18/2022

## 2022-12-18 NOTE — [facility] Group Notes (Signed)
[facility] Group Notes:  (Nursing/MHT/Case Management/Adjunct)  Date:  12/18/2022  Time:  9:24 PM  Type of Therapy:  Group Therapy  Participation Level:  Did Not Attend  Participation Quality:   na  Affect:   na  Cognitive:   na  Insight:  None  Engagement in Group:   na  Modes of Intervention:   na  Summary of Progress/Problems:  Pt did not attend group.  Orvan Falconer 12/18/2022, 9:24 PM

## 2022-12-18 NOTE — ED Notes (Signed)
Faxed IVC papers to [facility] for staff to look over at their request.

## 2022-12-18 NOTE — ED Notes (Addendum)
Madonna RN called to gather a little more medical history on this pt and pt was accepted to [facility] by Dr. Mancel Bale  [phone] number for report. Call facility at this number when patient is leaving our facility

## 2022-12-18 NOTE — Tx Team (Signed)
Initial Treatment Plan 12/18/2022 2:51 PM [name] [id number]    PATIENT STRESSORS: Financial difficulties   Marital or family conflict   Medication change or noncompliance   Traumatic event     PATIENT STRENGTHS: Capable of independent living  Communication skills  Physical Health  Religious Affiliation  Work skills    PATIENT IDENTIFIED PROBLEMS: Alterations in mood (anxiety, anger & depression) "I've been depressed, anxious, just multiple stressors".    Sleep disturbance "I've been sleeping about 2-3 hours / per night for months now".    Poor appetite "I haven't been able to eat good, you see I've lost weight from 210 lbs to now 198 lbs".    Suicidal "I took the Zyprexa to kill myself. It was intentional and I wanted to end it all"         DISCHARGE CRITERIA:  Improved stabilization in mood, thinking, and/or behavior Verbal commitment to aftercare and medication compliance  PRELIMINARY DISCHARGE PLAN: Outpatient therapy Placement in alternative living arrangements  PATIENT/FAMILY INVOLVEMENT: This treatment plan has been presented to and reviewed with the patient, Jacob Contreras. The patient have been given the opportunity to ask questions and make suggestions.  Keane Police, RN 12/18/2022, 2:51 PM

## 2022-12-18 NOTE — Group Note (Signed)
Date:  12/18/2022 Time:  4:18 PM  Group Topic/Focus:  Goals Group:   The focus of this group is to help patients establish daily goals to achieve during treatment and discuss how the patient can incorporate goal setting into their daily lives to aide in recovery.    Participation Level:  Did Not Attend  Participation Quality:      Affect:      Cognitive:      Insight: None  Engagement in Group:    Modes of Intervention:      Additional Comments:     [name] 12/18/2022, 4:18 PM

## 2022-12-18 NOTE — ED Notes (Signed)
Vitals updated. Patient is calm and cooperative.

## 2022-12-18 NOTE — Group Note (Signed)
LCSW Group Therapy Note   Group Date: 12/18/2022 Start Time: 1000 End Time: 1100   Type of Therapy and Topic: Group Therapy: Relationship Check-Ins   Participation Level: Did Not Attend   Description Group:  In this group patients are encouraged to rate how well or not well they are able to improve their relationships in Beliefs and Values, Communication, Family and Friends, Publishing rights manager and Household, and Intimacy. Patients will be able to discuss and identify what is going well within these aspects and what is not. Patients will be able to find appropriate ways, solutions, and skills that will help them within the selected relationships category. Patients will be encouraged to share and reflect on why things within their relationships are not going so well and get feedback from the instructor or their peers.  This group will be solution focused and process-oriented with patients' participation in sharing and listening to their own and peers experience; along with receive support and advice on how to improve or change the circumstance that is known to be challenging in their relationships category (Beliefs and Values, Communication, Family and Friends, Publishing rights manager and Household, and Intimacy).   Therapeutic Goals:  Patient will identify their strengths and weakness within their Beliefs and Values, Communication, Family and Friends, Publishing rights manager and Household, and Intimacy relationships. Patient will identify reasons why and how they can improve their relationships.  Patient will identify how they can be supportive and honest to themselves and in their relationships.  Patient will be able to gain support and give support to others with similar challenges.   Summary of Patient Progress: Did Not Attend     Therapeutic Modalities:  Solution Focused Therapy Cognitive Behavioral Therapy  Psychodynamic Therapy  Dialectical Behavior Therapy   Silas Flood , [place]  12/18/2022  3:34 PM    Sherre Lain, LCSWA 12/18/2022  3:34 PM

## 2022-12-19 ENCOUNTER — Encounter (HOSPITAL_COMMUNITY): Payer: Self-pay | Admitting: Nurse Practitioner

## 2022-12-19 DIAGNOSIS — F411 Generalized anxiety disorder: Secondary | ICD-10-CM | POA: Diagnosis present

## 2022-12-19 DIAGNOSIS — G4700 Insomnia, unspecified: Secondary | ICD-10-CM | POA: Diagnosis present

## 2022-12-19 DIAGNOSIS — F3181 Bipolar II disorder: Principal | ICD-10-CM | POA: Diagnosis present

## 2022-12-19 MED ORDER — CLONIDINE HCL 0.1 MG PO TABS: 0.1000 mg | ORAL_TABLET | Freq: Two times a day (BID) | ORAL | Status: DC | PRN

## 2022-12-19 MED ORDER — AMLODIPINE BESYLATE 5 MG PO TABS
5.0000 mg | ORAL_TABLET | Freq: Every day | ORAL | Status: DC
  Administered 2022-12-19 – 2022-12-23 (×5): 5 mg via ORAL
  Filled 2022-12-19 (×8): qty 1

## 2022-12-19 MED ORDER — ESCITALOPRAM OXALATE 5 MG PO TABS
5.0000 mg | ORAL_TABLET | Freq: Every day | ORAL | Status: AC
  Administered 2022-12-20: 5 mg via ORAL
  Filled 2022-12-19: qty 1

## 2022-12-19 MED ORDER — ESCITALOPRAM OXALATE 10 MG PO TABS
10.0000 mg | ORAL_TABLET | Freq: Every day | ORAL | Status: DC
  Administered 2022-12-21 – 2022-12-23 (×3): 10 mg via ORAL
  Filled 2022-12-19 (×5): qty 1

## 2022-12-19 MED ORDER — CLONIDINE HCL 0.1 MG PO TABS
0.1000 mg | ORAL_TABLET | Freq: Once | ORAL | Status: AC
  Administered 2022-12-19: 0.1 mg via ORAL
  Filled 2022-12-19 (×2): qty 1
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
## 2022-12-19 NOTE — Progress Notes (Addendum)
D: Pt denied SI/HI/AVH this morning. Pt rated his depression a 7/10, anxiety a 7/10, and feelings of hopelessness a 6/10. Per self inventory form pt reports; "My goal for today is to keep my head up and read my bible throughout the day". Pt has been isolative to his room for most of the day.  Pt has been cooperative throughout the shift.   A: RN provided support and encouragement to patient. Pt given scheduled medications as prescribed. Q15 min checks verified for safety.    R: Patient verbally contracts for safety. Patient compliant with medications and treatment plan. Pt is safe on the unit.   12/19/22 1100  Psych Admission Type (Psych Patients Only)  Admission Status Involuntary  Psychosocial Assessment  Patient Complaints Anxiety;Depression (Pt rates his anxiety 7/10 and his depression 7/10)  Eye Contact Fair  Facial Expression Anxious  Affect Anxious  Speech Logical/coherent  Interaction Guarded  Motor Activity Fidgety  Appearance/Hygiene Disheveled  Behavior Characteristics Cooperative  Mood Depressed;Anxious  Thought Process  Coherency WDL  Content WDL  Delusions None reported or observed  Perception WDL  Hallucination None reported or observed  Judgment Impaired  Confusion None  Danger to Self  Current suicidal ideation? Denies  Agreement Not to Harm Self Yes  Description of Agreement Verbal  Danger to Others  Danger to Others None reported or observed

## 2022-12-19 NOTE — Progress Notes (Signed)
[facility] Group Notes:  (Nursing/MHT/Case Management/Adjunct)  Date:  12/19/2022  Time:  2000  Type of Therapy:   wrap up group  Participation Level:  Active  Participation Quality:  Appropriate, Attentive, Sharing, and Supportive  Affect:  Appropriate  Cognitive:  Alert  Insight:  Improving  Engagement in Group:  Engaged  Modes of Intervention:  Clarification, Education, and Support  Summary of Progress/Problems: Positive thinking and positive change were discussed.  Shellia Cleverly 12/19/2022, 9:30 PM

## 2022-12-19 NOTE — [facility] Group Notes (Signed)
[facility] Group Notes:  (Nursing)  Date:  12/19/2022  Time:  400 PM  Type of Therapy:  Music Therapy  Participation Level:  Minimal  Participation Quality:  Attentive  Affect:  Flat  Cognitive:  Appropriate  Insight:  Improving  Engagement in Group:  Improving  Modes of Intervention:  Rapport Building, Socialization, and Support  Summary of Progress/Problems:  Waymond Cera 12/19/2022, 7:42 PM

## 2022-12-19 NOTE — B[facility] Suicide Risk Assessment (Signed)
[facility] INPATIENT:  Family/Significant Other Suicide Prevention Education  Suicide Prevention Education:  Education Completed; Raul Howald, aunt, [phone]  (name of family member/significant other) has been identified by the patient as the family member/significant other with whom the patient will be residing, and identified as the person(s) who will aid the patient in the event of a mental health crisis (suicidal ideations/suicide attempt).  With written consent from the patient, the family member/significant other has been provided the following suicide prevention education, prior to the and/or following the discharge of the patient.  Aunt states she has concerns since he has left the [employer], "he has not been the same." States she is limited in what she can do as she lives in [place], though she is unaware of any access to firearms or immediately lethal means but can not confidently confirm this.  The suicide prevention education provided includes the following: Suicide risk factors Suicide prevention and interventions National Suicide Hotline telephone number [facility] assessment telephone number [facility] Emergency Assistance [place] and/or Residential Mobile Crisis Unit telephone number  Request made of family/significant other to: Remove weapons (e.g., guns, rifles, knives), all items previously/currently identified as safety concern.   Remove drugs/medications (over-the-counter, prescriptions, illicit drugs), all items previously/currently identified as a safety concern.  The family member/significant other verbalizes understanding of the suicide prevention education information provided.  The family member/significant other agrees to remove the items of safety concern listed above.  Durenda Hurt 12/19/2022, 12:56 PM

## 2022-12-19 NOTE — B[facility] Group Notes (Signed)
[facility] Group Notes:  (Nursing/MHT/Case Management/Adjunct)  Date:  12/19/2022  Time:  11:13 AM  Type of Therapy:  Group Therapy  Participation Level:  Active  Participation Quality:  Appropriate  Affect:  Appropriate  Cognitive:  Appropriate  Insight:  Appropriate  Engagement in Group:  Engaged  Modes of Intervention:  Discussion  Summary of Progress/Problems:  Patient attended and participated in an orientation/ goals group today. Patient's goal for today is to keep your head up and remain positive.   Frances Furbish R Babak Lucus 12/19/2022, 11:13 AM

## 2022-12-19 NOTE — Progress Notes (Signed)
   12/19/22 0553  15 Minute Checks  Location Bedroom  Visual Appearance Calm  Behavior Sleeping  Sleep (Behavioral Health Patients Only)  Calculate sleep? (Click Yes once per 24 hr at 0600 safety check) Yes  Documented sleep last 24 hours 7.5
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
## 2022-12-20 ENCOUNTER — Encounter (HOSPITAL_COMMUNITY): Payer: Self-pay

## 2022-12-20 DIAGNOSIS — F3181 Bipolar II disorder: Secondary | ICD-10-CM | POA: Diagnosis not present

## 2022-12-20 LAB — LIPID PANEL
Cholesterol: 201 mg/dL — ABNORMAL HIGH (ref 0–200)
HDL: 42 mg/dL (ref >40)
LDL Cholesterol: 132 mg/dL — ABNORMAL HIGH (ref 0–99)
Total CHOL/HDL Ratio: 4.8 RATIO
Triglycerides: 137 mg/dL (ref <150)
VLDL: 27 mg/dL (ref 0–40)

## 2022-12-20 LAB — URINALYSIS, COMPLETE (UACMP) WITH MICROSCOPIC
Bilirubin Urine: NEGATIVE
Glucose, UA: NEGATIVE mg/dL
Ketones, ur: NEGATIVE mg/dL
Leukocytes,Ua: NEGATIVE
Nitrite: NEGATIVE
Protein, ur: NEGATIVE mg/dL
Specific Gravity, Urine: 1.025 (ref 1.005–1.030)
pH: 5 (ref 5.0–8.0)

## 2022-12-20 LAB — TSH: TSH: 1.647 u[IU]/mL (ref 0.350–4.500)

## 2022-12-20 LAB — VITAMIN D 25 HYDROXY (VIT D DEFICIENCY, FRACTURES): Vit D, 25-Hydroxy: 13.29 ng/mL — ABNORMAL LOW (ref 30–100)

## 2022-12-20 MED ORDER — VITAMIN D (ERGOCALCIFEROL) 1.25 MG (50000 UNIT) PO CAPS
50000.0000 [IU] | ORAL_CAPSULE | ORAL | Status: DC
  Administered 2022-12-20: 50000 [IU] via ORAL
  Filled 2022-12-20 (×2): qty 1

## 2022-12-20 NOTE — Plan of Care (Signed)
  Problem: Activity: Goal: Interest or engagement in activities will improve Outcome: Progressing Goal: Sleeping patterns will improve Outcome: Progressing   Problem: Coping: Goal: Ability to verbalize frustrations and anger appropriately will improve Outcome: Progressing Goal: Ability to demonstrate self-control will improve Outcome: Progressing   

## 2022-12-20 NOTE — Group Note (Signed)
Recreation Therapy Group Note   Group Topic:Stress Management  Group Date: 12/20/2022 Start Time: 0940 End Time: 0957 Facilitators: [name], LRT,CTRS Location: [address] Dayroom   Goal Area(s) Addresses:  Patient will identify positive stress management techniques. Patient will identify benefits of using stress management post d/c.  Group Description:  Meditation.  LRT and patients discussed ways to handle stress.  LRT played a meditation that focused on making the most of your day and encouraging each person to have the confidence and courage to know they can change their environment and self-esteem.    Affect/Mood: Appropriate   Participation Level: Engaged   Participation Quality: Independent   Behavior: Appropriate   Speech/Thought Process: Focused   Insight: Good   Judgement: Good   Modes of Intervention: Meditation   Patient Response to Interventions:  Engaged   Education Outcome:  Acknowledges education and In group clarification offered    Clinical Observations/Individualized Feedback: Pt attended and participated in group session.    Plan: Continue to engage patient in RT group sessions 2-3x/week.   Jacob Contreras, LRT,CTRS 12/20/2022 12:52 PM

## 2022-12-20 NOTE — Group Note (Unsigned)
Date:  12/20/2022 Time:  10:23 AM  Group Topic/Focus:  Goals Group:   The focus of this group is to help patients establish daily goals to achieve during treatment and discuss how the patient can incorporate goal setting into their daily lives to aide in recovery. Orientation:   The focus of this group is to educate the patient on the purpose and policies of crisis stabilization and provide a format to answer questions about their admission.  The group details unit policies and expectations of patients while admitted.     Participation Level:  {BHH PARTICIPATION WO:[id number]  Participation Quality:  {BHH PARTICIPATION QUALITY:22265}  Affect:  {BHH AFFECT:22266}  Cognitive:  {BHH COGNITIVE:22267}  Insight: {BHH Insight2:20797}  Engagement in Group:  {BHH ENGAGEMENT IN BP:[id number]  Modes of Intervention:  {BHH MODES OF INTERVENTION:22269}  Additional Comments:  ***  Dub Mikes 12/20/2022, 10:23 AM

## 2022-12-20 NOTE — Progress Notes (Signed)
   12/20/22 0800  Psych Admission Type (Psych Patients Only)  Admission Status Involuntary  Psychosocial Assessment  Patient Complaints Depression  Eye Contact Fair  Facial Expression Anxious  Affect Anxious  Speech Logical/coherent  Interaction Guarded  Motor Activity Fidgety  Appearance/Hygiene Disheveled  Behavior Characteristics Cooperative  Mood Depressed  Aggressive Behavior  Targets Self  Type of Behavior Other (Comment) (Overdosed on Zyprexa)  Effect No apparent injury  Thought Process  Coherency WDL  Content WDL  Delusions None reported or observed  Perception WDL  Hallucination None reported or observed  Judgment Impaired  Confusion None  Danger to Self  Current suicidal ideation? Denies  Agreement Not to Harm Self Yes  Description of Agreement verbal  Danger to Others  Danger to Others None reported or observed

## 2022-12-20 NOTE — Progress Notes (Signed)
[facility] MD Progress Note  12/20/2022 3:40 PM Jacob Contreras  MRN:  [id number] Principal Problem: Bipolar 2 disorder, major depressive episode ([place]) Diagnosis: Principal Problem:   Bipolar 2 disorder, major depressive episode ([place]) Active Problems:   Grief   Anxiety state   Insomnia  Reason For Admission: Jacob Contreras is a 32 year old African-American male with prior mental health diagnoses of MDD & PTSD who was taken to the [place] ED by EMS on 03/1 after taking 110 mg of Zyprexa in a suicide attempt in the context of several stressors including relationship issues and financial issues.  Patient was medically stabilized, and transferred under IVC status to this behavioral health Hospital for treatment and stabilization of his mental status.   24 hr chart review: Vital signs within normal limits since the addition of Norvasc 5 mg yesterday.  Patient is compliant with scheduled medications, did not require any as needed medications in the last 24 hours.  No behavioral episodes presented on the unit in the past 24 hours.  Patient is attending unit group sessions and appropriately interacting with peers and staff.  Patient assessment note, 12/20/2022: Pt presents today with a less depressed mood than yesterday and affect is congruent. His attention to personal hygiene and grooming is fair, eye contact is good, speech is clear & coherent. Thought contents are organized and logical, and pt currently denies SI/HI/AVH or paranoia. There is no evidence of delusional thoughts.    Patient reports that the Remeron 7.5 mg is continuing to help him fall asleep and stay asleep.  He reports a good appetite, denies being in any physical pain, reports being less anxious, and reports being in less depressed than at time of admission.  He reports that his overall mood is improving.  He reports that he is continuing to tolerate medications, denies medications side effects.  Lexapro 5 mg started today for depressive  symptoms, will increase tomorrow to 10 mg for management of depression and anxiety.  Patient was educated on rationales, benefits, and possible side effects of medication.  He was agreeable to all medication trials.  We will continue to assess on a daily basis, but he continues to meet criteria for inpatient hospitalization as his medications are continuing to be adjusted for management of his mood.  Patient admits to prior diagnoses of PTSD, but states that his PTSD typically manifest in anger outbursts, and denies having nightmares.  He admits to hypervigilance but states that this is also related to having served in the [employer].  We are continuing all medications as listed below.  Total Time spent with patient: 45 minutes  Past Psychiatric History: See H & P  Past Medical History:  Past Medical History:  Diagnosis Date   Hypertension    No past surgical history on file. Family History: No family history on file. Family Psychiatric  History: See H & P Social History:  Social History   Substance and Sexual Activity  Alcohol Use Never     Social History   Substance and Sexual Activity  Drug Use Never    Social History   Socioeconomic History   Marital status: Single    Spouse name: Not on file   Number of children: Not on file   Years of education: Not on file   Highest education level: Not on file  Occupational History   Not on file  Tobacco Use   Smoking status: Never   Smokeless tobacco: Never  Substance and Sexual Activity   Alcohol  use: Never   Drug use: Never   Sexual activity: Not on file  Other Topics Concern   Not on file  Social History Narrative   Not on file   Social Determinants of Health   Financial Resource Strain: Not on file  Food Insecurity: Not on file  Transportation Needs: Not on file  Physical Activity: Not on file  Stress: Not on file  Social Connections: Not on file   Sleep: Good  Appetite:  Good  Current Medications: Current  Facility-Administered Medications  Medication Dose Route Frequency Provider Last Rate Last Admin   acetaminophen (TYLENOL) tablet 650 mg  650 mg Oral Q6H PRN Cleatrice Burke, Josephine C, NP       alum & mag hydroxide-simeth (MAALOX/MYLANTA) 200-200-20 MG/5ML suspension 30 mL  30 mL Oral Q4H PRN Cleatrice Burke, Josephine C, NP       amLODipine (NORVASC) tablet 5 mg  5 mg Oral Daily Nicholes Rough, NP   5 mg at 12/20/22 0755   cloNIDine (CATAPRES) tablet 0.1 mg  0.1 mg Oral BID PRN Nicholes Rough, NP       diphenhydrAMINE (BENADRYL) capsule 50 mg  50 mg Oral TID PRN Charmaine Downs C, NP       Or   diphenhydrAMINE (BENADRYL) injection 50 mg  50 mg Intramuscular TID PRN Delfin Gant, NP       [START ON 12/21/2022] escitalopram (LEXAPRO) tablet 10 mg  10 mg Oral Daily Leauna Sharber, NP       haloperidol (HALDOL) tablet 5 mg  5 mg Oral TID PRN Charmaine Downs C, NP       Or   haloperidol lactate (HALDOL) injection 5 mg  5 mg Intramuscular TID PRN Charmaine Downs C, NP       hydrOXYzine (ATARAX) tablet 25 mg  25 mg Oral TID PRN Charmaine Downs C, NP       LORazepam (ATIVAN) tablet 2 mg  2 mg Oral TID PRN Charmaine Downs C, NP       Or   LORazepam (ATIVAN) injection 2 mg  2 mg Intramuscular TID PRN Charmaine Downs C, NP       magnesium hydroxide (MILK OF MAGNESIA) suspension 30 mL  30 mL Oral Daily PRN Charmaine Downs C, NP       mirtazapine (REMERON) tablet 7.5 mg  7.5 mg Oral QHS Izabela Ow, NP   7.5 mg at 12/19/22 2235   Vitamin D (Ergocalciferol) (DRISDOL) 1.25 MG (50000 UNIT) capsule 50,000 Units  50,000 Units Oral Q7 days Nicholes Rough, NP        Lab Results:  Results for orders placed or performed during the hospital encounter of 12/18/22 (from the past 48 hour(s))  Urinalysis, Complete w Microscopic -Urine, Clean Catch     Status: Abnormal   Collection Time: 12/19/22  6:08 PM  Result Value Ref Range   Color, Urine AMBER (A) YELLOW    Comment: BIOCHEMICALS MAY BE AFFECTED BY  COLOR   APPearance TURBID (A) CLEAR   Specific Gravity, Urine 1.025 1.005 - 1.030   pH 5.0 5.0 - 8.0   Glucose, UA NEGATIVE NEGATIVE mg/dL   Hgb urine dipstick MODERATE (A) NEGATIVE   Bilirubin Urine NEGATIVE NEGATIVE   Ketones, ur NEGATIVE NEGATIVE mg/dL   Protein, ur NEGATIVE NEGATIVE mg/dL   Nitrite NEGATIVE NEGATIVE   Leukocytes,Ua NEGATIVE NEGATIVE   RBC / HPF 6-10 0 - 5 RBC/hpf   WBC, UA 0-5 0 - 5 WBC/hpf   Bacteria, UA RARE (A)  NONE SEEN   Squamous Epithelial / HPF 0-5 0 - 5 /HPF   Mucus PRESENT    Ca Oxalate Crys, UA PRESENT     Comment: Performed at [facility], [place] [address], [place], [place] [address]  TSH     Status: None   Collection Time: 12/20/22  6:27 AM  Result Value Ref Range   TSH 1.647 0.350 - 4.500 uIU/mL    Comment: Performed by a 3rd Generation assay with a functional sensitivity of <=0.01 uIU/mL. Performed at [facility], [place] [address] [place], [place] [address]   Lipid panel     Status: Abnormal   Collection Time: 12/20/22  6:27 AM  Result Value Ref Range   Cholesterol 201 (H) 0 - 200 mg/dL   Triglycerides 137 <150 mg/dL   HDL 42 >40 mg/dL   Total CHOL/HDL Ratio 4.8 RATIO   VLDL 27 0 - 40 mg/dL   LDL Cholesterol 132 (H) 0 - 99 mg/dL    Comment:        Total Cholesterol/HDL:CHD Risk Coronary Heart Disease Risk Table                     Men   Women  1/2 Average Risk   3.4   3.3  Average Risk       5.0   4.4  2 X Average Risk   9.6   7.1  3 X Average Risk  23.4   11.0        Use the calculated Patient Ratio above and the CHD Risk Table to determine the patient's CHD Risk.        ATP III CLASSIFICATION (LDL):  <100     mg/dL   Optimal  100-129  mg/dL   Near or Above                    Optimal  130-159  mg/dL   Borderline  160-189  mg/dL   High  >190     mg/dL   Very High Performed at [place] [address], [place], [place] [address]   VITAMIN D 25 Hydroxy (Vit-D  Deficiency, Fractures)     Status: Abnormal   Collection Time: 12/20/22  6:27 AM  Result Value Ref Range   Vit D, 25-Hydroxy 13.29 (L) 30 - 100 ng/mL    Comment: (NOTE) Vitamin D deficiency has been defined by the [place] practice guideline as a level of serum 25-OH  vitamin D less than 20 ng/mL (1,2). The Endocrine Society went on to  further define vitamin D insufficiency as a level between 21 and 29  ng/mL (2).  1. IOM (Institute of Medicine). 2010. Dietary reference intakes for  calcium and D. [place]: The [employer]. 2. Holick MF, Binkley , Bischoff-Ferrari HA, et al. Evaluation,  treatment, and prevention of vitamin D deficiency: an Endocrine  Society clinical practice guideline, JCEM. 2011 Jul; 96(7): 1911-30.  Performed at [facility], [place] [address] [place],  [address]     Blood Alcohol level:  Lab Results  Component Value Date   ETH <10 [id number]    Metabolic Disorder Labs: No results found for: "HGBA1C", "MPG" No results found for: "PROLACTIN" Lab Results  Component Value Date   CHOL 201 (H) 12/20/2022   TRIG 137 12/20/2022   HDL 42 12/20/2022   CHOLHDL 4.8 12/20/2022   VLDL  27 12/20/2022   LDLCALC 132 (H) 12/20/2022    Physical Findings: AIMS:  , ,  ,  ,    CIWA:    COWS:     Musculoskeletal: Strength & Muscle Tone: within normal limits Gait & Station: normal Patient leans: N/A  Psychiatric Specialty Exam:  Presentation  General Appearance:  Appropriate for Environment; Fairly Groomed  Eye Contact: Good  Speech: Clear and Coherent  Speech Volume: Normal  Handedness: Right   Mood and Affect  Mood: Depressed; Anxious  Affect: Congruent   Thought Process  Thought Processes: Coherent  Descriptions of Associations:Intact  Orientation:Full (Time, Place and Person)  Thought Content:Logical  History of Schizophrenia/Schizoaffective disorder:No data  recorded Duration of Psychotic Symptoms:No data recorded Hallucinations:Hallucinations: None  Ideas of Reference:None  Suicidal Thoughts:Suicidal Thoughts: No  Homicidal Thoughts:Homicidal Thoughts: No   Sensorium  Memory: Immediate Good  Judgment: Good  Insight: Good   Executive Functions  Concentration: Good  Attention Span: Good  Recall: Good  Fund of Knowledge: Good  Language: Good   Psychomotor Activity  Psychomotor Activity: Psychomotor Activity: Normal   Assets  Assets: Communication Skills; Social Support   Sleep  Sleep: Sleep: Good    Physical Exam: Physical Exam Constitutional:      Appearance: Normal appearance.  HENT:     Head: Normocephalic.     Nose: Nose normal.  Eyes:     Pupils: Pupils are equal, round, and reactive to light.  Pulmonary:     Effort: Pulmonary effort is normal.  Musculoskeletal:        General: Normal range of motion.     Cervical back: Normal range of motion.  Neurological:     Mental Status: He is alert.    Review of Systems  Constitutional:  Negative for fever.  HENT:  Negative for hearing loss.   Eyes:  Negative for blurred vision.  Respiratory:  Negative for cough.   Cardiovascular:  Negative for chest pain.  Gastrointestinal:  Negative for heartburn.  Genitourinary:  Negative for dysuria.  Musculoskeletal:  Negative for myalgias.  Skin:  Negative for rash.  Neurological:  Negative for dizziness.  Psychiatric/Behavioral:  Positive for depression. Negative for hallucinations, memory loss, substance abuse and suicidal ideas. The patient is nervous/anxious and has insomnia.    Blood pressure (!) 121/93, pulse 76, temperature 98.5 F (36.9 C), temperature source Oral, resp. rate 16, SpO2 100 %. There is no height or weight on file to calculate BMI.  Treatment Plan Summary: Daily contact with patient to assess and evaluate symptoms and progress in treatment and Medication management    Observation Level/Precautions:  15 minute checks  Laboratory:  Labs reviewed   Psychotherapy:  Unit Group sessions  Medications:  See [facility]  Consultations:  To be determined   Discharge Concerns:  Safety, medication compliance, mood stability  Estimated LOS: 5-7 days  Other:  N/A    Labs independently reviewed on 12/20/2022: Respiratory panel negative, CBC WNL,CMP WNL with the exception of glucose which was 107.  UDS is negative for substances of abuse.  BAL less than 10.  EKG with QTc of 380.   Orders placed: TSH WNL., hemoglobin A1c pending. baseline UA with moderate amounts of blood and rare bacteria. Will f/u with pt regarding symptoms of UTI., lipid panel mostly WNL.  vitamin D low at 13.29, will supplement with Vitamin D 50.000 units weekly.   PLAN Safety and Monitoring: Voluntary admission to inpatient psychiatric unit for safety, stabilization and treatment Daily contact with  patient to assess and evaluate symptoms and progress in treatment Patient's case to be discussed in multi-disciplinary team meeting Observation Level : q15 minute checks Vital signs: q12 hours Precautions: Safety   Diagnoses:  Principal Problem:   Bipolar 2 disorder, major depressive episode (HCC) Active Problems:   Grief   Anxiety state   Insomnia   Medications -Start Vitamin D 50.000 units weekly for Vitamin D deficiency -Continue Remeron 7.5 mg nightly for sleep -Continue Lexapro 5 mg on 3/4 followed by 10 mg daily on 3/5 for MDD and anxiety -Continue Norvasc 5 mg daily for hypertension -Continue clonidine 0.1 mg as needed twice daily for SBP over 160 or DBP over 100 -Continue agitation protocol: Benadryl/Ativan/Haldol.  Please see the [facility] for complete order.   PRNS -Continue Hydroxyzine 25 mg every 6 hours PRN -Continue Tylenol 650 mg every 6 hours PRN for mild pain -Continue Maalox 30 mg every 4 hrs PRN for indigestion -Continue Imodium 2-4 mg as needed for diarrhea -Continue Milk of  Magnesia as needed every 6 hrs for constipation -Continue Zofran disintegrating tabs every 6 hrs PRN for nausea    Discharge Planning: Social work and case management to assist with discharge planning and identification of hospital follow-up needs prior to discharge Estimated LOS: 5-7 days Discharge Concerns: Need to establish a safety plan; Medication compliance and effectiveness Discharge Goals: Return home with outpatient referrals for mental health follow-up including medication management/psychotherapy   I certify that inpatient services furnished can reasonably be expected to improve the patient's condition.    Nicholes Rough, NP 12/20/2022, 3:40 PM

## 2022-12-20 NOTE — BHH Counselor (Signed)
CSW provided the Pt with a packet that contains information including shelter and housing resources, free and reduced price food information, clothing resources, crisis center information, a [place] card, and suicide prevention information.    CSW spoke with the Pt about discharge planning.  The Pt states that he will be returning to his ex-girlfriend's house.  He states that during admission to [facility] and just prior to his phone being put in his locker, he received a text message stating that she will allow him to come back to the home.  He states that he will continue looking for additional housing options and hopes to have the [place] in [place] help him with housing in the future.  CSW will continue to speak with the Pt about obtaining the Ex-Girlfriends phone number to complete SPE with her prior to discharge.

## 2022-12-20 NOTE — BHH Group Notes (Signed)
[place] Group Notes:  (Nursing/MHT/Case Management/Adjunct)  Date:  12/20/2022  Time:  8:39 PM  Type of Therapy:   AA  Participation Level:  Active  Participation Quality:  Appropriate  Affect:  Appropriate  Cognitive:  Appropriate  Insight:  Appropriate  Engagement in Group:  Engaged  Modes of Intervention:  Education  Summary of Progress/Problems:  Pt attend group.  Orvan Falconer 12/20/2022, 8:39 PM

## 2022-12-20 NOTE — Progress Notes (Signed)
   12/20/22 0200  Psych Admission Type (Psych Patients Only)  Admission Status Involuntary  Psychosocial Assessment  Patient Complaints Anxiety;Depression  Eye Contact Fair  Facial Expression Anxious  Affect Anxious  Speech Logical/coherent  Interaction Guarded  Motor Activity Fidgety  Appearance/Hygiene Disheveled  Behavior Characteristics Cooperative  Mood Depressed  Thought Process  Coherency WDL  Content WDL  Delusions None reported or observed  Perception WDL  Hallucination None reported or observed  Judgment Impaired  Confusion None  Danger to Self  Current suicidal ideation? Denies  Agreement Not to Harm Self Yes  Description of Agreement Verbal  Danger to Others  Danger to Others None reported or observed   Alert/oriented. Makes needs/concerns known to staff. Pleasant cooperative with staff. Denies SI/HI/A/V hallucinations. Med compliant. Will encourage continue compliance and progression towards goals. Verbally contracted for safety. Will continue to monitor.

## 2022-12-20 NOTE — BHH Group Notes (Signed)
Adult Psychoeducational Group Note  Date:  12/20/2022 Time:  1:28 PM  Group Topic/Focus:  Wellness Toolbox:   The focus of this group is to discuss various aspects of wellness, balancing those aspects and exploring ways to increase the ability to experience wellness.  Patients will create a wellness toolbox for use upon discharge.  Participation Level:  Active  Participation Quality:  Attentive  Affect:  Appropriate  Cognitive:  Alert  Insight: Appropriate  Engagement in Group:  Engaged  Modes of Intervention:  Activity  Additional Comments:  Patient attended and participated in the [place] group.  Annie Sable 12/20/2022, 1:28 PM

## 2022-12-20 NOTE — BHH Group Notes (Signed)
Adult Psychoeducational Group Note  Date:  12/20/2022 Time:  4:46 PM  Group Topic/Focus:  Healthy Communication:   The focus of this group is to discuss communication, barriers to communication, as well as healthy ways to communicate with others.  Participation Level:  Active  Participation Quality:  Attentive  Affect:  Appropriate  Cognitive:  Alert  Insight: Appropriate  Engagement in Group:  Engaged  Modes of Intervention:  Activity  Additional Comments:  Patient attended and participated in the [facility] group.  Annie Sable 12/20/2022, 4:46 PM

## 2022-12-20 NOTE — Progress Notes (Signed)
   12/20/22 2046  Psych Admission Type (Psych Patients Only)  Admission Status Involuntary  Psychosocial Assessment  Eye Contact Fair  Facial Expression Anxious  Affect Anxious  Speech Logical/coherent  Interaction Guarded  Motor Activity Fidgety  Appearance/Hygiene Disheveled  Behavior Characteristics Cooperative  Mood Depressed  Thought Process  Coherency WDL  Content WDL  Delusions None reported or observed  Perception WDL  Hallucination None reported or observed  Judgment Impaired  Confusion None  Danger to Self  Current suicidal ideation? Denies  Agreement Not to Harm Self Yes  Description of Agreement Verbal  Danger to Others  Danger to Others None reported or observed   Alert/oriented. Makes needs/concerns known to staff. Pleasant cooperative with staff. Denies SI/HI/A/V hallucinations. Med compliant. Patient states went to group. Will encourage continue compliance and progression towards goals. Verbally contracted for safety. Will continue to monitor.

## 2022-12-20 NOTE — Group Note (Signed)
Occupational Therapy Group Note  Group Topic:Coping Skills  Group Date: 12/20/2022 Start Time: 1430 End Time: 1510 Facilitators: Brantley Stage, OT   Group Description: Group encouraged increased engagement and participation through discussion and activity focused on "Coping Ahead." Patients were split up into teams and selected a card from a stack of positive coping strategies. Patients were instructed to act out/charade the coping skill for other peers to guess and receive points for their team. Discussion followed with a focus on identifying additional positive coping strategies and patients shared how they were going to cope ahead over the weekend while continuing hospitalization stay.  Therapeutic Goal(s): Identify positive vs negative coping strategies. Identify coping skills to be used during hospitalization vs coping skills outside of hospital/at home Increase participation in therapeutic group environment and promote engagement in treatment   Participation Level: Engaged   Participation Quality: Independent   Behavior: Appropriate   Speech/Thought Process: Relevant   Affect/Mood: Appropriate   Insight: Fair   Judgement: Fair   Individualization: pt was engaged in their participation of group discussion/activity. New skills were identified  Modes of Intervention: Education  Patient Response to Interventions:  Attentive   Plan: Continue to engage patient in OT groups 2 - 3x/week.  12/20/2022  Brantley Stage, OT  Cornell Barman, OT

## 2022-12-20 NOTE — Plan of Care (Signed)
  Problem: Safety: Goal: Periods of time without injury will increase Outcome: Progressing   

## 2022-12-20 NOTE — BHH Group Notes (Signed)
Spiritual care group on grief and loss facilitated by Chaplain Janne Napoleon, Bcc and Lysle Morales, counseling intern.  Group Goal: Support / Education around grief and loss  Members engage in facilitated group support and psycho-social education.  Group Description:  Following introductions and group rules, group members engaged in facilitated group dialogue and support around topic of loss, with particular support around experiences of loss in their lives. Group Identified types of loss (relationships / self / things) and identified patterns, circumstances, and changes that precipitate losses. Reflected on thoughts / feelings around loss, normalized grief responses, and recognized variety in grief experience. Group encouraged individual reflection on safe space and on the coping skills that they are already utilizing.  Group drew on Adlerian / Rogerian and narrative framework  Patient Progress: Jacob Contreras attended group and actively engaged and participated in group conversation and activities. His comments demonstrated good insight.  [address] [place] Pager, [phone]

## 2022-12-20 NOTE — BH IP Treatment Plan (Signed)
Interdisciplinary Treatment and Diagnostic Plan Update  12/20/2022 Time of Session: 11:15 AM  Jacob Contreras MRN: [id number]  Principal Diagnosis: Bipolar 2 disorder, major depressive episode ([place])  Secondary Diagnoses: Principal Problem:   Bipolar 2 disorder, major depressive episode ([place]) Active Problems:   Grief   Anxiety state   Insomnia   Current Medications:  Current Facility-Administered Medications  Medication Dose Route Frequency Provider Last Rate Last Admin   acetaminophen (TYLENOL) tablet 650 mg  650 mg Oral Q6H PRN Charmaine Downs C, NP       alum & mag hydroxide-simeth (MAALOX/MYLANTA) 200-200-20 MG/5ML suspension 30 mL  30 mL Oral Q4H PRN Cleatrice Burke, Josephine C, NP       amLODipine (NORVASC) tablet 5 mg  5 mg Oral Daily Nicholes Rough, NP   5 mg at 12/20/22 0755   cloNIDine (CATAPRES) tablet 0.1 mg  0.1 mg Oral BID PRN Nicholes Rough, NP       diphenhydrAMINE (BENADRYL) capsule 50 mg  50 mg Oral TID PRN Delfin Gant, NP       Or   diphenhydrAMINE (BENADRYL) injection 50 mg  50 mg Intramuscular TID PRN Delfin Gant, NP       [START ON 12/21/2022] escitalopram (LEXAPRO) tablet 10 mg  10 mg Oral Daily Nkwenti, Doris, NP       haloperidol (HALDOL) tablet 5 mg  5 mg Oral TID PRN Charmaine Downs C, NP       Or   haloperidol lactate (HALDOL) injection 5 mg  5 mg Intramuscular TID PRN Delfin Gant, NP       hydrOXYzine (ATARAX) tablet 25 mg  25 mg Oral TID PRN Charmaine Downs C, NP       LORazepam (ATIVAN) tablet 2 mg  2 mg Oral TID PRN Delfin Gant, NP       Or   LORazepam (ATIVAN) injection 2 mg  2 mg Intramuscular TID PRN Charmaine Downs C, NP       magnesium hydroxide (MILK OF MAGNESIA) suspension 30 mL  30 mL Oral Daily PRN Charmaine Downs C, NP       mirtazapine (REMERON) tablet 7.5 mg  7.5 mg Oral QHS Nkwenti, Doris, NP   7.5 mg at 12/19/22 2235   PTA Medications: Medications Prior to Admission  Medication Sig Dispense Refill Last  Dose   hydrOXYzine (VISTARIL) 25 MG capsule Take 25 mg by mouth 2 (two) times daily as needed for anxiety.   12/16/2022   OLANZapine (ZYPREXA) 20 MG tablet Take 10 mg by mouth at bedtime.   12/16/2022    Patient Stressors: Financial difficulties   Marital or family conflict   Medication change or noncompliance   Traumatic event    Patient Strengths: Capable of independent living  Communication skills  Physical Health  Religious Affiliation  Work skills   Treatment Modalities: Medication Management, Group therapy, Case management,  1 to 1 session with clinician, Psychoeducation, Recreational therapy.   Physician Treatment Plan for Primary Diagnosis: Bipolar 2 disorder, major depressive episode ([place]) Long Term Goal(s):     Short Term Goals:    Medication Management: Evaluate patient's response, side effects, and tolerance of medication regimen.  Therapeutic Interventions: 1 to 1 sessions, Unit Group sessions and Medication administration.  Evaluation of Outcomes: Not Progressing  Physician Treatment Plan for Secondary Diagnosis: Principal Problem:   Bipolar 2 disorder, major depressive episode ([place]) Active Problems:   Grief   Anxiety state   Insomnia  Long Term Goal(s):  Short Term Goals:       Medication Management: Evaluate patient's response, side effects, and tolerance of medication regimen.  Therapeutic Interventions: 1 to 1 sessions, Unit Group sessions and Medication administration.  Evaluation of Outcomes: Not Progressing   RN Treatment Plan for Primary Diagnosis: Bipolar 2 disorder, major depressive episode ([place]) Long Term Goal(s): Knowledge of disease and therapeutic regimen to maintain health will improve  Short Term Goals: Ability to remain free from injury will improve, Ability to verbalize frustration and anger appropriately will improve, Ability to demonstrate self-control, Ability to participate in decision making will improve, Ability to verbalize  feelings will improve, Ability to disclose and discuss suicidal ideas, Ability to identify and develop effective coping behaviors will improve, and Compliance with prescribed medications will improve  Medication Management: RN will administer medications as ordered by provider, will assess and evaluate patient's response and provide education to patient for prescribed medication. RN will report any adverse and/or side effects to prescribing provider.  Therapeutic Interventions: 1 on 1 counseling sessions, Psychoeducation, Medication administration, Evaluate responses to treatment, Monitor vital signs and CBGs as ordered, Perform/monitor CIWA, COWS, AIMS and Fall Risk screenings as ordered, Perform wound care treatments as ordered.  Evaluation of Outcomes: Not Progressing   LCSW Treatment Plan for Primary Diagnosis: Bipolar 2 disorder, major depressive episode ([place]) Long Term Goal(s): Safe transition to appropriate next level of care at discharge, Engage patient in therapeutic group addressing interpersonal concerns.  Short Term Goals: Engage patient in aftercare planning with referrals and resources, Increase social support, Increase ability to appropriately verbalize feelings, Increase emotional regulation, Facilitate acceptance of mental health diagnosis and concerns, Facilitate patient progression through stages of change regarding substance use diagnoses and concerns, Identify triggers associated with mental health/substance abuse issues, and Increase skills for wellness and recovery  Therapeutic Interventions: Assess for all discharge needs, 1 to 1 time with Social worker, Explore available resources and support systems, Assess for adequacy in community support network, Educate family and significant other(s) on suicide prevention, Complete Psychosocial Assessment, Interpersonal group therapy.  Evaluation of Outcomes: Progressing   Progress in Treatment: Attending groups: Yes. Participating  in groups: Yes. Taking medication as prescribed: Yes. Toleration medication: Yes. Family/Significant other contact made: Yes, individual(s) contacted:  Tevan Irwin, aunt, [phone] Patient understands diagnosis: Yes. Discussing patient identified problems/goals with staff: Yes. Medical problems stabilized or resolved: Yes. Denies suicidal/homicidal ideation: Yes. Issues/concerns per patient self-inventory: No.   New problem(s) identified: No, Describe:  None Reported   New Short Term/Long Term Goal(s): medication stabilization, elimination of SI thoughts, development of comprehensive mental wellness plan.    Patient Goals:  " Get back to my normal living , stay on my medication regime, and continue to refer back to my safety plans I have already established at home "    Discharge Plan or Barriers: Patient recently admitted. CSW will continue to follow and assess for appropriate referrals and possible discharge planning.    Reason for Continuation of Hospitalization: Anxiety Depression Medication stabilization Suicidal ideation Other; describe Grieving the loss of his grandmother   Estimated Length of Stay: 3-5 days   Last [place] Suicide Severity Risk Score: [facility] Admission (Current) from 12/18/2022 in [place] [facility]  C-SSRS RISK CATEGORY No Risk       Last [place] 2/9 Scores:     No data to display          Scribe for Treatment Team: Charlett Lango 12/20/2022 1:31 PM

## 2022-12-21 DIAGNOSIS — F3181 Bipolar II disorder: Secondary | ICD-10-CM | POA: Diagnosis not present

## 2022-12-21 LAB — HEMOGLOBIN A1C
Hgb A1c MFr Bld: 5.7 % — ABNORMAL HIGH (ref 4.8–5.6)
Mean Plasma Glucose: 117 mg/dL

## 2022-12-21 NOTE — Group Note (Signed)
Date:  12/21/2022 Time:  10:27 AM  Group Topic/Focus:  Making Healthy Choices:   The focus of this group is to help patients identify negative/unhealthy choices they were using prior to admission and identify positive/healthier coping strategies to replace them upon discharge.    Participation Level:  Active  Participation Quality:  Appropriate  Affect:  Appropriate  Cognitive:  Appropriate  Insight: Appropriate  Engagement in Group:  Engaged  Modes of Intervention:  Discussion  Additional Comments:  Patient watch mental health video, as they ate their snacks.   Lisa Milian W Royanne Warshaw [id number], 10:27 AM

## 2022-12-21 NOTE — Group Note (Unsigned)
Date:  12/21/2022 Time:  9:16 PM  Group Topic/Focus:  Wrap-Up Group:   The focus of this group is to help patients review their daily goal of treatment and discuss progress on daily workbooks.     Participation Level:  {BHH PARTICIPATION HD:[id number]  Participation Quality:  {BHH PARTICIPATION QUALITY:22265}  Affect:  {BHH AFFECT:22266}  Cognitive:  {BHH COGNITIVE:22267}  Insight: {BHH Insight2:20797}  Engagement in Group:  {BHH ENGAGEMENT IN JY:[id number]  Modes of Intervention:  {BHH MODES OF INTERVENTION:22269}  Additional Comments:  ***  Debe Coder 12/21/2022, 9:16 PM

## 2022-12-21 NOTE — Progress Notes (Signed)
   12/21/22 0825  Psych Admission Type (Psych Patients Only)  Admission Status Involuntary  Psychosocial Assessment  Patient Complaints None  Eye Contact Fair  Facial Expression Pensive  Affect Blunted  Speech Logical/coherent  Interaction Guarded  Motor Activity Fidgety  Appearance/Hygiene Improved  Behavior Characteristics Cooperative  Mood Depressed  Thought Process  Coherency WDL  Content WDL  Delusions None reported or observed  Perception WDL  Hallucination None reported or observed  Judgment Poor  Confusion None  Danger to Self  Current suicidal ideation? Denies  Danger to Others  Danger to Others None reported or observed   Pt pleasant upon approach but guarded when responding to questions about feelings and emotions.  Pt has been taking medications without incident and no adverse drug reactions are noted. Pt attending groups and is interacting with peers.

## 2022-12-21 NOTE — Group Note (Unsigned)
Date:  12/21/2022 Time:  8:45 PM  Group Topic/Focus:  Wrap-Up Group:   The focus of this group is to help patients review their daily goal of treatment and discuss progress on daily workbooks.     Participation Level:  {BHH PARTICIPATION WO:[id number]  Participation Quality:  {BHH PARTICIPATION QUALITY:22265}  Affect:  {BHH AFFECT:22266}  Cognitive:  {BHH COGNITIVE:22267}  Insight: {BHH Insight2:20797}  Engagement in Group:  {BHH ENGAGEMENT IN BP:[id number]  Modes of Intervention:  {BHH MODES OF INTERVENTION:22269}  Additional Comments:  ***  Debe Coder 12/21/2022, 8:45 PM

## 2022-12-21 NOTE — Group Note (Unsigned)
Date:  12/21/2022 Time:  8:56 PM  Group Topic/Focus:  Wrap-Up Group:   The focus of this group is to help patients review their daily goal of treatment and discuss progress on daily workbooks.     Participation Level:  {BHH PARTICIPATION HD:[id number]  Participation Quality:  {BHH PARTICIPATION QUALITY:22265}  Affect:  {BHH AFFECT:22266}  Cognitive:  {BHH COGNITIVE:22267}  Insight: {BHH Insight2:20797}  Engagement in Group:  {BHH ENGAGEMENT IN JY:[id number]  Modes of Intervention:  {BHH MODES OF INTERVENTION:22269}  Additional Comments:  ***  Debe Coder 12/21/2022, 8:56 PM

## 2022-12-21 NOTE — Progress Notes (Signed)
[facility] MD Progress Note  12/21/2022 4:34 PM Jacob Contreras  MRN:  [id number] Principal Problem: Bipolar 2 disorder, major depressive episode ([place]) Diagnosis: Principal Problem:   Bipolar 2 disorder, major depressive episode ([place]) Active Problems:   Grief   Anxiety state   Insomnia  Reason For Admission: Jacob Contreras is a 32 year old African-American male with prior mental health diagnoses of MDD & PTSD who was taken to the [facility] ED by EMS on 03/1 after taking 110 mg of Zyprexa in a suicide attempt in the context of several stressors including relationship issues and financial issues.  Patient was medically stabilized, and transferred under IVC status to this behavioral health Hospital for treatment and stabilization of his mental status.   24 hr chart review: Vital signs within normal limits for the past 24 hrs.  Patient is compliant with scheduled medications, did not require any as needed medications in the last 24 hours.  No behavioral episodes presented on the unit in the past 24 hours.  Patient is continuing to attend unit group sessions and appropriately interacting with peers and staff.  Patient assessment note, 12/21/2022: Mood is continuing to show improvements on a daily basis. Affect is congruent. Pt's attention to personal hygiene and grooming is fair, eye contact is good, speech is clear & coherent. Thought contents are organized and logical, and pt currently denies SI/HI/AVH or paranoia. There is no evidence of delusional thoughts.    Patient reports that he is continuing to tolerate his medications well, denies medication related side effects, verbalizes improvement in depressive symptoms and anxiety since hospitalization, and is objectively presents with an improved mood.  Patient reports that he is sleeping better, reports a good appetite, denies being in any physical pain.  I will plan is to discharge patient on Thursday 3/7 if safety planning has been completed by CSW, and  discharge follow-up appointments have been made.  We are continuing medications as listed below.  Total Time spent with patient: 45 minutes  Past Psychiatric History: See H & P  Past Medical History:  Past Medical History:  Diagnosis Date   Hypertension    No past surgical history on file. Family History: No family history on file. Family Psychiatric  History: See H & P Social History:  Social History   Substance and Sexual Activity  Alcohol Use Never     Social History   Substance and Sexual Activity  Drug Use Never    Social History   Socioeconomic History   Marital status: Single    Spouse name: Not on file   Number of children: Not on file   Years of education: Not on file   Highest education level: Not on file  Occupational History   Not on file  Tobacco Use   Smoking status: Never   Smokeless tobacco: Never  Substance and Sexual Activity   Alcohol use: Never   Drug use: Never   Sexual activity: Not on file  Other Topics Concern   Not on file  Social History Narrative   Not on file   Social Determinants of Health   Financial Resource Strain: Not on file  Food Insecurity: Not on file  Transportation Needs: Not on file  Physical Activity: Not on file  Stress: Not on file  Social Connections: Not on file   Sleep: Good  Appetite:  Good  Current Medications: Current Facility-Administered Medications  Medication Dose Route Frequency Provider Last Rate Last Admin   acetaminophen (TYLENOL) tablet 650 mg  650 mg Oral Q6H PRN Charmaine Downs C, NP       alum & mag hydroxide-simeth (MAALOX/MYLANTA) 200-200-20 MG/5ML suspension 30 mL  30 mL Oral Q4H PRN Charmaine Downs C, NP       amLODipine (NORVASC) tablet 5 mg  5 mg Oral Daily Nicholes Rough, NP   5 mg at 12/21/22 [id number]   cloNIDine (CATAPRES) tablet 0.1 mg  0.1 mg Oral BID PRN Nicholes Rough, NP       diphenhydrAMINE (BENADRYL) capsule 50 mg  50 mg Oral TID PRN Delfin Gant, NP       Or    diphenhydrAMINE (BENADRYL) injection 50 mg  50 mg Intramuscular TID PRN Delfin Gant, NP       escitalopram (LEXAPRO) tablet 10 mg  10 mg Oral Daily Nicholes Rough, NP   10 mg at 12/21/22 [id number]   haloperidol (HALDOL) tablet 5 mg  5 mg Oral TID PRN Delfin Gant, NP       Or   haloperidol lactate (HALDOL) injection 5 mg  5 mg Intramuscular TID PRN Delfin Gant, NP       hydrOXYzine (ATARAX) tablet 25 mg  25 mg Oral TID PRN Delfin Gant, NP       LORazepam (ATIVAN) tablet 2 mg  2 mg Oral TID PRN Delfin Gant, NP       Or   LORazepam (ATIVAN) injection 2 mg  2 mg Intramuscular TID PRN Delfin Gant, NP       magnesium hydroxide (MILK OF MAGNESIA) suspension 30 mL  30 mL Oral Daily PRN Charmaine Downs C, NP       mirtazapine (REMERON) tablet 7.5 mg  7.5 mg Oral QHS Mohmmad Saleeby, NP   7.5 mg at 12/20/22 2128   Vitamin D (Ergocalciferol) (DRISDOL) 1.25 MG (50000 UNIT) capsule 50,000 Units  50,000 Units Oral Q7 days Nicholes Rough, NP   50,000 Units at 12/20/22 2128    Lab Results:  Results for orders placed or performed during the hospital encounter of 12/18/22 (from the past 48 hour(s))  Urinalysis, Complete w Microscopic -Urine, Clean Catch     Status: Abnormal   Collection Time: 12/19/22  6:08 PM  Result Value Ref Range   Color, Urine AMBER (A) YELLOW    Comment: BIOCHEMICALS MAY BE AFFECTED BY COLOR   APPearance TURBID (A) CLEAR   Specific Gravity, Urine 1.025 1.005 - 1.030   pH 5.0 5.0 - 8.0   Glucose, UA NEGATIVE NEGATIVE mg/dL   Hgb urine dipstick MODERATE (A) NEGATIVE   Bilirubin Urine NEGATIVE NEGATIVE   Ketones, ur NEGATIVE NEGATIVE mg/dL   Protein, ur NEGATIVE NEGATIVE mg/dL   Nitrite NEGATIVE NEGATIVE   Leukocytes,Ua NEGATIVE NEGATIVE   RBC / HPF 6-10 0 - 5 RBC/hpf   WBC, UA 0-5 0 - 5 WBC/hpf   Bacteria, UA RARE (A) NONE SEEN   Squamous Epithelial / HPF 0-5 0 - 5 /HPF   Mucus PRESENT    Ca Oxalate Crys, UA PRESENT     Comment:  Performed at [facility], [place] [address] [place], [place] [address]  TSH     Status: None   Collection Time: 12/20/22  6:27 AM  Result Value Ref Range   TSH 1.647 0.350 - 4.500 uIU/mL    Comment: Performed by a 3rd Generation assay with a functional sensitivity of <=0.01 uIU/mL. Performed at [facility], [place] [address] [place], [place] [address]  Hemoglobin A1c     Status: Abnormal   Collection Time: 12/20/22  6:27 AM  Result Value Ref Range   Hgb A1c MFr Bld 5.7 (H) 4.8 - 5.6 %    Comment: (NOTE)         Prediabetes: 5.7 - 6.4         Diabetes: >6.4         Glycemic control for adults with diabetes: <7.0    Mean Plasma Glucose 117 mg/dL    Comment: (NOTE) Performed At: [facility] [place], [place] [id number] Rush Farmer MD [id number]   Lipid panel     Status: Abnormal   Collection Time: 12/20/22  6:27 AM  Result Value Ref Range   Cholesterol 201 (H) 0 - 200 mg/dL   Triglycerides 137 <150 mg/dL   HDL 42 >40 mg/dL   Total CHOL/HDL Ratio 4.8 RATIO   VLDL 27 0 - 40 mg/dL   LDL Cholesterol 132 (H) 0 - 99 mg/dL    Comment:        Total Cholesterol/HDL:CHD Risk Coronary Heart Disease Risk Table                     Men   Women  1/2 Average Risk   3.4   3.3  Average Risk       5.0   4.4  2 X Average Risk   9.6   7.1  3 X Average Risk  23.4   11.0        Use the calculated Patient Ratio above and the CHD Risk Table to determine the patient's CHD Risk.        ATP III CLASSIFICATION (LDL):  <100     mg/dL   Optimal  100-129  mg/dL   Near or Above                    Optimal  130-159  mg/dL   Borderline  160-189  mg/dL   High  >190     mg/dL   Very High Performed at [facility] [place] [address], [place], [place] [address]   VITAMIN D 25 Hydroxy (Vit-D Deficiency, Fractures)     Status: Abnormal   Collection Time: 12/20/22  6:27 AM  Result Value Ref Range   Vit D, 25-Hydroxy 13.29  (L) 30 - 100 ng/mL    Comment: (NOTE) Vitamin D deficiency has been defined by the [place] practice guideline as a level of serum 25-OH  vitamin D less than 20 ng/mL (1,2). The Endocrine Society went on to  further define vitamin D insufficiency as a level between 21 and 29  ng/mL (2).  1. IOM (Institute of Medicine). 2010. Dietary reference intakes for  calcium and D. [place]: The [employer]. 2. Holick MF, Binkley [place], Bischoff-Ferrari HA, et al. Evaluation,  treatment, and prevention of vitamin D deficiency: an Endocrine  Society clinical practice guideline, JCEM. 2011 Jul; 96(7): 1911-30.  Performed at [facility], [place] [address], [place], [place] [address]     Blood Alcohol level:  Lab Results  Component Value Date   [facility] <10 [id number]    Metabolic Disorder Labs: Lab Results  Component Value Date   HGBA1C 5.7 (H) 12/20/2022   MPG 117 12/20/2022   No results found for: "PROLACTIN" Lab Results  Component Value Date   CHOL 201 (H) 12/20/2022   TRIG 137  12/20/2022   HDL 42 12/20/2022   CHOLHDL 4.8 12/20/2022   VLDL 27 12/20/2022   LDLCALC 132 (H) 12/20/2022    Physical Findings: AIMS:  , ,  ,  ,    CIWA:    COWS:     Musculoskeletal: Strength & Muscle Tone: within normal limits Gait & Station: normal Patient leans: N/A  Psychiatric Specialty Exam:  Presentation  General Appearance:  Fairly Groomed  Eye Contact: Good  Speech: Clear and Coherent  Speech Volume: Normal  Handedness: Right   Mood and Affect  Mood: Depressed  Affect: Congruent; Appropriate   Thought Process  Thought Processes: Coherent  Descriptions of Associations:Intact  Orientation:Full (Time, Place and Person)  Thought Content:Logical  History of Schizophrenia/Schizoaffective disorder:No data recorded Duration of Psychotic Symptoms:No data recorded Hallucinations:Hallucinations: None  Ideas of  Reference:None  Suicidal Thoughts:Suicidal Thoughts: No  Homicidal Thoughts:Homicidal Thoughts: No   Sensorium  Memory: Immediate Good  Judgment: Good  Insight: Good   Executive Functions  Concentration: Good  Attention Span: Good  Recall: Good  Fund of Knowledge: Good  Language: Good   Psychomotor Activity  Psychomotor Activity: Psychomotor Activity: Normal   Assets  Assets: Communication Skills   Sleep  Sleep: Sleep: Good    Physical Exam: Physical Exam Constitutional:      Appearance: Normal appearance.  HENT:     Head: Normocephalic.     Nose: Nose normal.  Eyes:     Pupils: Pupils are equal, round, and reactive to light.  Pulmonary:     Effort: Pulmonary effort is normal.  Musculoskeletal:        General: Normal range of motion.     Cervical back: Normal range of motion.  Neurological:     Mental Status: He is alert.    Review of Systems  Constitutional:  Negative for fever.  HENT:  Negative for hearing loss.   Eyes:  Negative for blurred vision.  Respiratory:  Negative for cough.   Cardiovascular:  Negative for chest pain.  Gastrointestinal:  Negative for heartburn.  Genitourinary:  Negative for dysuria.  Musculoskeletal:  Negative for myalgias.  Skin:  Negative for rash.  Neurological:  Negative for dizziness.  Psychiatric/Behavioral:  Positive for depression. Negative for hallucinations, memory loss, substance abuse and suicidal ideas. The patient is nervous/anxious and has insomnia.    Blood pressure 113/83, pulse 83, temperature 98.6 F (37 C), temperature source Oral, resp. rate 16, SpO2 99 %. There is no height or weight on file to calculate BMI.  Treatment Plan Summary: Daily contact with patient to assess and evaluate symptoms and progress in treatment and Medication management   Observation Level/Precautions:  15 minute checks  Laboratory:  Labs reviewed   Psychotherapy:  Unit Group sessions  Medications:  See  [facility]  Consultations:  To be determined   Discharge Concerns:  Safety, medication compliance, mood stability  Estimated LOS: 5-7 days  Other:  N/A    Labs independently reviewed on 12/20/2022: Respiratory panel negative, CBC WNL,CMP WNL with the exception of glucose which was 107.  UDS is negative for substances of abuse.  BAL less than 10.  EKG with QTc of 380.   Orders placed: TSH WNL., hemoglobin A1c is 5.7, rendering patient a prediabetic.  He will need PCP follow-up after discharge. baseline UA with moderate amounts of blood and rare bacteria. Will f/u with pt regarding symptoms of UTI., lipid panel mostly WNL.  vitamin D low at 13.29, will supplement with Vitamin D 50.000 units  weekly.   PLAN Safety and Monitoring: Voluntary admission to inpatient psychiatric unit for safety, stabilization and treatment Daily contact with patient to assess and evaluate symptoms and progress in treatment Patient's case to be discussed in multi-disciplinary team meeting Observation Level : q15 minute checks Vital signs: q12 hours Precautions: Safety   Diagnoses:  Principal Problem:   Bipolar 2 disorder, major depressive episode (H[facility]) Active Problems:   Grief   Anxiety state   Insomnia   Medications -Continue vitamin D 50.000 units weekly for Vitamin D deficiency -Continue Remeron 7.5 mg nightly for sleep -Continue Lexapro 5 mg on 3/4 followed by 10 mg daily on 3/5 for MDD and anxiety -Continue Norvasc 5 mg daily for hypertension -Continue clonidine 0.1 mg as needed twice daily for SBP over 160 or DBP over 100 -Continue agitation protocol: Benadryl/Ativan/Haldol.  Please see the [facility] for complete order.   PRNS -Continue Hydroxyzine 25 mg every 6 hours PRN -Continue Tylenol 650 mg every 6 hours PRN for mild pain -Continue Maalox 30 mg every 4 hrs PRN for indigestion -Continue Imodium 2-4 mg as needed for diarrhea -Continue Milk of Magnesia as needed every 6 hrs for constipation -Continue Zofran  disintegrating tabs every 6 hrs PRN for nausea    Discharge Planning: Social work and case management to assist with discharge planning and identification of hospital follow-up needs prior to discharge Estimated LOS: 5-7 days Discharge Concerns: Need to establish a safety plan; Medication compliance and effectiveness Discharge Goals: Return home with outpatient referrals for mental health follow-up including medication management/psychotherapy   I certify that inpatient services furnished can reasonably be expected to improve the patient's condition.    Nicholes Rough, NP 12/21/2022, 4:34 PMPatient ID: Jacob Contreras, male   DOB: 06-Nov-1990, 32 y.o.   MRN: [id number]

## 2022-12-21 NOTE — B[facility] Suicide Risk Assessment (Signed)
[place] INPATIENT:  Family/Significant Other Suicide Prevention Education  Suicide Prevention Education:  Education Completed; Vickie Epley [phone] (Ex-Girlfriend) has been identified by the patient as the family member/significant other with whom the patient will be residing, and identified as the person(s) who will aid the patient in the event of a mental health crisis (suicidal ideations/suicide attempt).  With written consent from the patient, the family member/significant other has been provided the following suicide prevention education, prior to the and/or following the discharge of the patient.  The suicide prevention education provided includes the following: Suicide risk factors Suicide prevention and interventions National Suicide Hotline telephone number [place] assessment telephone number [facility] Emergency Assistance [place] and/or Residential Mobile Crisis Unit telephone number  Request made of family/significant other to: Remove weapons (e.g., guns, rifles, knives), all items previously/currently identified as safety concern.   Remove drugs/medications (over-the-counter, prescriptions, illicit drugs), all items previously/currently identified as a safety concern.  The family member/significant other verbalizes understanding of the suicide prevention education information provided.  The family member/significant other agrees to remove the items of safety concern listed above.  CSW spoke with Ms. Mancel Bale who confirms that her ex-boyfriend can return to the home after discharge.  She states that there are no firearms or weapons in the home.  She states that she will lock up all of the medications in the home and will continue to be a support for her ex-boyfriend after his discharge from the hospital.  She states that they are still friends at this time.  CSW completed SPE with Ms. Mancel Bale.   Jacob Contreras 12/21/2022, 1:27 PM

## 2022-12-21 NOTE — Group Note (Signed)
Date:  12/21/2022 Time:  10:11 AM  Group Topic/Focus:  Goals Group:   The focus of this group is to help patients establish daily goals to achieve during treatment and discuss how the patient can incorporate goal setting into their daily lives to aide in recovery. Orientation:   The focus of this group is to educate the patient on the purpose and policies of crisis stabilization and provide a format to answer questions about their admission.  The group details unit policies and expectations of patients while admitted.    Participation Level:  Active  Participation Quality:  Appropriate  Affect:  Appropriate  Cognitive:  Appropriate  Insight: Appropriate  Engagement in Group:  Engaged  Modes of Intervention:  Discussion  Additional Comments:  Patient attended morning orientation/goal setting  group and said that his goal for today is to find a reason to laugh.   Taylan Marez W Rien Marland [id number], 10:11 AM

## 2022-12-21 NOTE — Progress Notes (Signed)
Adult Psychoeducational Group Note  Date:  12/21/2022 Time:  9:54 PM  Group Topic/Focus:  Wrap-Up Group:   The focus of this group is to help patients review their daily goal of treatment and discuss progress on daily workbooks.  Participation Level:  Active  Participation Quality:  Appropriate  Affect:  Appropriate  Cognitive:  Appropriate  Insight: Appropriate  Engagement in Group:  Engaged  Modes of Intervention:  Education and Exploration  Additional Comments:  Patient attended and participated in group tonight. He reports that the significant thing that happen today was having a lot of laughter   Debe Coder 12/21/2022, 9:54 PM

## 2022-12-21 NOTE — Group Note (Unsigned)
Date:  12/21/2022 Time:  9:01 PM  Group Topic/Focus:  Wrap-Up Group:   The focus of this group is to help patients review their daily goal of treatment and discuss progress on daily workbooks.     Participation Level:  {BHH PARTICIPATION WO:[id number]  Participation Quality:  {BHH PARTICIPATION QUALITY:22265}  Affect:  {BHH AFFECT:22266}  Cognitive:  {BHH COGNITIVE:22267}  Insight: {BHH Insight2:20797}  Engagement in Group:  {BHH ENGAGEMENT IN BP:[id number]  Modes of Intervention:  {BHH MODES OF INTERVENTION:22269}  Additional Comments:  ***  Debe Coder 12/21/2022, 9:01 PM

## 2022-12-21 NOTE — Group Note (Unsigned)
Date:  12/21/2022 Time:  8:54 PM  Group Topic/Focus:  Wrap-Up Group:   The focus of this group is to help patients review their daily goal of treatment and discuss progress on daily workbooks.     Participation Level:  {BHH PARTICIPATION WO:[id number]  Participation Quality:  {BHH PARTICIPATION QUALITY:22265}  Affect:  {BHH AFFECT:22266}  Cognitive:  {BHH COGNITIVE:22267}  Insight: {BHH Insight2:20797}  Engagement in Group:  {BHH ENGAGEMENT IN BP:8198245  Modes of Intervention:  {BHH MODES OF INTERVENTION:22269}  Additional Comments:  ***  Debe Coder 12/21/2022, 8:54 PM

## 2022-12-21 NOTE — Group Note (Signed)
Recreation Therapy Group Note   Group Topic:Animal Assisted Therapy   Group Date: 12/21/2022 Start Time: 1430 End Time: 1500 Facilitators: Carlosdaniel Grob-McCall, LRT,CTRS Location: [address] Dayroom   Animal-Assisted Activity (AAA) Program Checklist/Progress Notes Patient Eligibility Criteria Checklist & Daily Group note for Rec Tx Intervention  AAA/T Program Assumption of Risk Form signed by Patient/ or Parent Legal Guardian Yes  Patient is free of allergies or severe asthma Yes  Patient reports no fear of animals Yes  Patient reports no history of cruelty to animals Yes  Patient understands his/her participation is voluntary Yes  Patient washes hands before animal contact Yes  Patient washes hands after animal contact Yes   Affect/Mood: Appropriate   Participation Level: Engaged   Participation Quality: Independent   Behavior: Appropriate    Clinical Observations/Individualized Feedback: Patient attended session and interacted appropriately with therapy dog and peers. Patient asked appropriate questions about therapy dog and his training. Patient shared stories about their pets at home with group.    Plan: Continue to engage patient in RT group sessions 2-3x/week.   Amily Depp-McCall, LRT,CTRS 12/21/2022 4:01 PM

## 2022-12-21 NOTE — Group Note (Unsigned)
Date:  12/21/2022 Time:  8:50 PM  Group Topic/Focus:  Wrap-Up Group:   The focus of this group is to help patients review their daily goal of treatment and discuss progress on daily workbooks.     Participation Level:  {BHH PARTICIPATION HD:[id number]  Participation Quality:  {BHH PARTICIPATION QUALITY:22265}  Affect:  {BHH AFFECT:22266}  Cognitive:  {BHH COGNITIVE:22267}  Insight: {BHH Insight2:20797}  Engagement in Group:  {BHH ENGAGEMENT IN JY:[id number]  Modes of Intervention:  {BHH MODES OF INTERVENTION:22269}  Additional Comments:  ***  Debe Coder 12/21/2022, 8:50 PM

## 2022-12-21 NOTE — Group Note (Unsigned)
Date:  12/21/2022 Time:  8:58 PM  Group Topic/Focus:  Wrap-Up Group:   The focus of this group is to help patients review their daily goal of treatment and discuss progress on daily workbooks.     Participation Level:  {BHH PARTICIPATION WO:[id number]  Participation Quality:  {BHH PARTICIPATION QUALITY:22265}  Affect:  {BHH AFFECT:22266}  Cognitive:  {BHH COGNITIVE:22267}  Insight: {BHH Insight2:20797}  Engagement in Group:  {BHH ENGAGEMENT IN BP:8198245  Modes of Intervention:  {BHH MODES OF INTERVENTION:22269}  Additional Comments:  ***  Debe Coder 12/21/2022, 8:58 PM

## 2022-12-21 NOTE — Group Note (Signed)
LCSW Group Therapy Note   Group Date: 12/21/2022 Start Time: 1100 End Time: 1200  Type of Therapy and Topic:  Group Therapy:  Self-Care after Hospitalization  Participation Level:  Active   Description of Group This process group involved patients discussing how they plan to take care of themselves in a better manner when they get home from the hospital.  The group started with patients listing one healthy self-care they hope to engage in at discharge that they did not use prior to admission.  We discussed a variety of other means of self-care which had a large range from hygiene activities to eating to setting boundaries to participating in peer support groups.  The primary focus by CSW was to point out commonalities.  When there were participants who stated everyone else can get help, but they themselves are "beyond help," this was discussed in detail and this belief was gently challenged.  Finally, it was announced that immediately following group was to be "Hygiene Hour" where everyone would go to their rooms and take care of their personal hygiene.  This was met with wide acceptance.  Therapeutic Goals Patient will identify and describe one self-care activity to deliberately plan to use upon hospital discharge Patient will participate in generating additional ideas about healthy self-care options when they return to the community Patients will be supportive of one another and receive support from others Patients will be challenged to realize that they are not "beyond help" any more than other participants in the room are  Summary of Patient Progress:  The Pt attended group and remained there the entire time.  The Pt accepted all worksheets and materials provided.  The Pt demonstrated understanding of the topic being discussed by asking questions and participating in open discussion with their peers and staff.  The Pt was appropriate with all peers and staff.  The Pt was able to create a plan  of action and set goals for after discharge.    Therapeutic Modalities Brief Solution-Focused Therapy Psychoeducation  Darleen Crocker, [place] 12/21/2022  1:05 PM

## 2022-12-22 DIAGNOSIS — F3181 Bipolar II disorder: Secondary | ICD-10-CM | POA: Diagnosis not present

## 2022-12-22 NOTE — Group Note (Signed)
Date:  12/22/2022 Time:  4:35 PM  Group Topic/Focus:  Healthy Communication:   The focus of this group is to discuss communication, barriers to communication, as well as healthy ways to communicate with others. Making Healthy Choices:   The focus of this group is to help patients identify negative/unhealthy choices they were using prior to admission and identify positive/healthier coping strategies to replace them upon discharge.    Participation Level:  Active  Participation Quality:  Appropriate  Affect:  Appropriate  Cognitive:  Appropriate  Insight: Appropriate  Engagement in Group:  Engaged  Modes of Intervention:  Discussion  Additional Comments:    Garvin Fila 12/22/2022, 4:35 PM

## 2022-12-22 NOTE — Progress Notes (Signed)
   12/22/22 0603  15 Minute Checks  Location Bedroom  Visual Appearance Calm  Behavior Sleeping  Sleep (Behavioral Health Patients Only)  Calculate sleep? (Click Yes once per 24 hr at 0600 safety check) Yes  Documented sleep last 24 hours 8.25

## 2022-12-22 NOTE — Progress Notes (Signed)
[facility] Group Notes:  (Nursing/MHT/Case Management/Adjunct)  Date:  12/22/2022  Time:  2000  Type of Therapy:   wrap up group  Participation Level:    Participation Quality:  Appropriate and Attentive  Affect:  Appropriate  Cognitive:  Alert and Appropriate  Insight:    Engagement in Group:  Engaged  Modes of Intervention:  Education and Support  Summary of Progress/Problems:  Shellia Cleverly 12/22/2022, 10:20 PM

## 2022-12-22 NOTE — Plan of Care (Signed)
  Problem: Education: Goal: Knowledge of [facility] Education information/materials will improve Outcome: Progressing Goal: Emotional status will improve Outcome: Progressing Goal: Mental status will improve Outcome: Progressing Goal: Verbalization of understanding the information provided will improve Outcome: Progressing   Problem: Activity: Goal: Interest or engagement in activities will improve Outcome: Progressing Goal: Sleeping patterns will improve Outcome: Progressing   Problem: Coping: Goal: Ability to verbalize frustrations and anger appropriately will improve Outcome: Progressing Goal: Ability to demonstrate self-control will improve Outcome: Progressing   Problem: Health Behavior/Discharge Planning: Goal: Identification of resources available to assist in meeting health care needs will improve Outcome: Progressing Goal: Compliance with treatment plan for underlying cause of condition will improve Outcome: Progressing   Problem: Physical Regulation: Goal: Ability to maintain clinical measurements within normal limits will improve Outcome: Progressing   Problem: Safety: Goal: Periods of time without injury will increase Outcome: Progressing   Problem: Education: Goal: Ability to make informed decisions regarding treatment will improve Outcome: Progressing   Problem: Coping: Goal: Coping ability will improve Outcome: Progressing   Problem: Health Behavior/Discharge Planning: Goal: Identification of resources available to assist in meeting health care needs will improve Outcome: Progressing   Problem: Medication: Goal: Compliance with prescribed medication regimen will improve Outcome: Progressing   Problem: Self-Concept: Goal: Ability to disclose and discuss suicidal ideas will improve Outcome: Progressing Goal: Will verbalize positive feelings about self Outcome: Progressing   Problem: Education: Goal: Utilization of techniques to improve  thought processes will improve Outcome: Progressing Goal: Knowledge of the prescribed therapeutic regimen will improve Outcome: Progressing   Problem: Activity: Goal: Interest or engagement in leisure activities will improve Outcome: Progressing Goal: Imbalance in normal sleep/wake cycle will improve Outcome: Progressing   Problem: Coping: Goal: Coping ability will improve Outcome: Progressing Goal: Will verbalize feelings Outcome: Progressing   Problem: Health Behavior/Discharge Planning: Goal: Ability to make decisions will improve Outcome: Progressing Goal: Compliance with therapeutic regimen will improve Outcome: Progressing   Problem: Role Relationship: Goal: Will demonstrate positive changes in social behaviors and relationships Outcome: Progressing

## 2022-12-22 NOTE — Progress Notes (Signed)
[facility] MD Progress Note  12/22/2022 2:05 PM [name]  MRN:  [id number] Principal Problem: Bipolar 2 disorder, major depressive episode ([place]) Diagnosis: Principal Problem:   Bipolar 2 disorder, major depressive episode ([place]) Active Problems:   Grief   Anxiety state   Insomnia  Reason For Admission: Jacob Contreras is a 32 year old African-American male with prior mental health diagnoses of MDD & PTSD who was taken to the [place] ED by EMS on 03/1 after taking 110 mg of Zyprexa in a suicide attempt in the context of several stressors including relationship issues and financial issues.  Patient was medically stabilized, and transferred under IVC status to this behavioral health Hospital for treatment and stabilization of his mental status.   24 hr chart review: Vital signs within normal limits for the past 24 hrs.  Patient is compliant with scheduled medications, did not require any as needed medications in the last 24 hours.  No behavioral episodes presented on the unit in the past 24 hours.  Patient is continuing to attend unit group sessions and appropriately interacting with peers and staff. Sleep hours documented from last night as 8.25 hrs.   Patient assessment note, 12/22/2022: Mood is today is euthymic & affect is congruent. Pt's attention to personal hygiene and grooming is good, eye contact is good, speech is clear & coherent. Thought contents are organized and logical, and pt currently denies SI/HI/AVH or paranoia. There is no evidence of delusional thoughts. Mood has improved significantly since hospitalization.  Patient reports that he is continuing to tolerate taking his medications well, states that he is continuing to sleep well on the low dose of Remeron, reports a good appetite, denies being in any physical pain, reports that he is looking forward to being discharged tomorrow.  Mood has improved significantly as per both objective and subjective assessments.  When asked today about  the suicide attempt, patient verbalizes that he now regrets that looking back, states he blacked out, states he was combative as per information provided to him by his girlfriend.  He states that he should have used other coping mechanisms during the argument with this GF which led to the overdose, such as cooking which he loves. He states that he now realizes just how dangerous his actions were, and states that he will use other coping mechanisms whenever he gets angry in the future.  Plan is to discharge pt back to his GF's home on 3/7. Pt has been educated on this, and has verbalized understanding, and has stated that his GF is aware and will pick him up from this hospital at some point in the afternoon tomorrow. We will continue medications as listed below with no changes as they are currently effective in management of pt's symptoms.  Total Time spent with patient: 45 minutes  Past Psychiatric History: See H & P  Past Medical History:  Past Medical History:  Diagnosis Date   Hypertension    No past surgical history on file. Family History: No family history on file. Family Psychiatric  History: See H & P Social History:  Social History   Substance and Sexual Activity  Alcohol Use Never     Social History   Substance and Sexual Activity  Drug Use Never    Social History   Socioeconomic History   Marital status: Single    Spouse name: Not on file   Number of children: Not on file   Years of education: Not on file   Highest education level:  Not on file  Occupational History   Not on file  Tobacco Use   Smoking status: Never   Smokeless tobacco: Never  Substance and Sexual Activity   Alcohol use: Never   Drug use: Never   Sexual activity: Not on file  Other Topics Concern   Not on file  Social History Narrative   Not on file   Social Determinants of Health   Financial Resource Strain: Not on file  Food Insecurity: Not on file  Transportation Needs: Not on file   Physical Activity: Not on file  Stress: Not on file  Social Connections: Not on file   Sleep: Good  Appetite:  Good  Current Medications: Current Facility-Administered Medications  Medication Dose Route Frequency Provider Last Rate Last Admin   acetaminophen (TYLENOL) tablet 650 mg  650 mg Oral Q6H PRN Cleatrice Burke, Josephine C, NP       alum & mag hydroxide-simeth (MAALOX/MYLANTA) 200-200-20 MG/5ML suspension 30 mL  30 mL Oral Q4H PRN Cleatrice Burke, Josephine C, NP       amLODipine (NORVASC) tablet 5 mg  5 mg Oral Daily Nicholes Rough, NP   5 mg at 12/22/22 [id number]   cloNIDine (CATAPRES) tablet 0.1 mg  0.1 mg Oral BID PRN Nicholes Rough, NP       diphenhydrAMINE (BENADRYL) capsule 50 mg  50 mg Oral TID PRN Charmaine Downs C, NP       Or   diphenhydrAMINE (BENADRYL) injection 50 mg  50 mg Intramuscular TID PRN Charmaine Downs C, NP       escitalopram (LEXAPRO) tablet 10 mg  10 mg Oral Daily Nicholes Rough, NP   10 mg at 12/22/22 [id number]   haloperidol (HALDOL) tablet 5 mg  5 mg Oral TID PRN Charmaine Downs C, NP       Or   haloperidol lactate (HALDOL) injection 5 mg  5 mg Intramuscular TID PRN Delfin Gant, NP       hydrOXYzine (ATARAX) tablet 25 mg  25 mg Oral TID PRN Charmaine Downs C, NP       LORazepam (ATIVAN) tablet 2 mg  2 mg Oral TID PRN Charmaine Downs C, NP       Or   LORazepam (ATIVAN) injection 2 mg  2 mg Intramuscular TID PRN Charmaine Downs C, NP       magnesium hydroxide (MILK OF MAGNESIA) suspension 30 mL  30 mL Oral Daily PRN Charmaine Downs C, NP       mirtazapine (REMERON) tablet 7.5 mg  7.5 mg Oral QHS Makailey Hodgkin, NP   7.5 mg at 12/21/22 2107   Vitamin D (Ergocalciferol) (DRISDOL) 1.25 MG (50000 UNIT) capsule 50,000 Units  50,000 Units Oral Q7 days Nicholes Rough, NP   50,000 Units at 12/20/22 2128    Lab Results:  No results found for this or any previous visit (from the past 47 hour(s)).   Blood Alcohol level:  Lab Results  Component Value Date   ETH  <10 [id number]    Metabolic Disorder Labs: Lab Results  Component Value Date   HGBA1C 5.7 (H) 12/20/2022   MPG 117 12/20/2022   No results found for: "PROLACTIN" Lab Results  Component Value Date   CHOL 201 (H) 12/20/2022   TRIG 137 12/20/2022   HDL 42 12/20/2022   CHOLHDL 4.8 12/20/2022   VLDL 27 12/20/2022   LDLCALC 132 (H) 12/20/2022    Physical Findings: AIMS:  , ,  ,  ,    CIWA:  COWS:     Musculoskeletal: Strength & Muscle Tone: within normal limits Gait & Station: normal Patient leans: N/A  Psychiatric Specialty Exam:  Presentation  General Appearance:  Appropriate for Environment; Fairly Groomed  Eye Contact: Good  Speech: Clear and Coherent  Speech Volume: Normal  Handedness: Right   Mood and Affect  Mood: Euthymic  Affect: Congruent   Thought Process  Thought Processes: Coherent  Descriptions of Associations:Intact  Orientation:Full (Time, Place and Person)  Thought Content:Logical  History of Schizophrenia/Schizoaffective disorder:No data recorded Duration of Psychotic Symptoms:No data recorded Hallucinations:Hallucinations: None  Ideas of Reference:None  Suicidal Thoughts:Suicidal Thoughts: No  Homicidal Thoughts:Homicidal Thoughts: No   Sensorium  Memory: Immediate Good  Judgment: Good  Insight: Good   Executive Functions  Concentration: Good  Attention Span: Good  Recall: Good  Fund of Knowledge: Good  Language: Good   Psychomotor Activity  Psychomotor Activity: Psychomotor Activity: Normal   Assets  Assets: Communication Skills   Sleep  Sleep: Sleep: Good    Physical Exam: Physical Exam Constitutional:      Appearance: Normal appearance.  HENT:     Head: Normocephalic.     Nose: Nose normal.  Eyes:     Pupils: Pupils are equal, round, and reactive to light.  Pulmonary:     Effort: Pulmonary effort is normal.  Musculoskeletal:        General: Normal range of motion.      Cervical back: Normal range of motion.  Neurological:     Mental Status: He is alert.    Review of Systems  Constitutional:  Negative for fever.  HENT:  Negative for hearing loss.   Eyes:  Negative for blurred vision.  Respiratory:  Negative for cough.   Cardiovascular:  Negative for chest pain.  Gastrointestinal:  Negative for heartburn.  Genitourinary:  Negative for dysuria.  Musculoskeletal:  Negative for myalgias.  Skin:  Negative for rash.  Neurological:  Negative for dizziness.  Psychiatric/Behavioral:  Positive for depression. Negative for hallucinations, memory loss, substance abuse and suicidal ideas. The patient is nervous/anxious and has insomnia.    Blood pressure 112/87, pulse 87, temperature 98 F (36.7 C), temperature source Oral, resp. rate 18, SpO2 98 %. There is no height or weight on file to calculate BMI.  Treatment Plan Summary: Daily contact with patient to assess and evaluate symptoms and progress in treatment and Medication management   Observation Level/Precautions:  15 minute checks  Laboratory:  Labs reviewed   Psychotherapy:  Unit Group sessions  Medications:  See [facility]  Consultations:  To be determined   Discharge Concerns:  Safety, medication compliance, mood stability  Estimated LOS: 5-7 days  Other:  N/A    Labs independently reviewed on 12/20/2022: Respiratory panel negative, CBC WNL,CMP WNL with the exception of glucose which was 107.  UDS is negative for substances of abuse.  BAL less than 10.  EKG with QTc of 380.   Orders placed: TSH WNL., hemoglobin A1c is 5.7, rendering patient a prediabetic.  He will need PCP follow-up after discharge. He reports that he has a PCP with the [place], [place]. Baseline UA with moderate amounts of blood and rare bacteria. Pt is asymptomatic for UTI. lipid panel mostly WNL.  vitamin D low at 13.29, will supplement with Vitamin D 50.000 units weekly.   PLAN Safety and Monitoring: Voluntary admission to inpatient  psychiatric unit for safety, stabilization and treatment Daily contact with patient to assess and evaluate symptoms and progress in treatment Patient's  case to be discussed in multi-disciplinary team meeting Observation Level : q15 minute checks Vital signs: q12 hours Precautions: Safety   Diagnoses:  Principal Problem:   Bipolar 2 disorder, major depressive episode (HCC) Active Problems:   Grief   Anxiety state   Insomnia   Medications -Continue vitamin D 50.000 units weekly for Vitamin D deficiency -Continue Remeron 7.5 mg nightly for sleep -Continue Lexapro 5 mg on 3/4 followed by 10 mg daily on 3/5 for MDD and anxiety -Continue Norvasc 5 mg daily for hypertension -Continue clonidine 0.1 mg as needed twice daily for SBP over 160 or DBP over 100 -Continue agitation protocol: Benadryl/Ativan/Haldol.  Please see the [facility] for complete order.   PRNS -Continue Hydroxyzine 25 mg every 6 hours PRN -Continue Tylenol 650 mg every 6 hours PRN for mild pain -Continue Maalox 30 mg every 4 hrs PRN for indigestion -Continue Imodium 2-4 mg as needed for diarrhea -Continue Milk of Magnesia as needed every 6 hrs for constipation -Continue Zofran disintegrating tabs every 6 hrs PRN for nausea    Discharge Planning: Social work and case management to assist with discharge planning and identification of hospital follow-up needs prior to discharge Estimated LOS: 5-7 days Discharge Concerns: Need to establish a safety plan; Medication compliance and effectiveness Discharge Goals: Return home with outpatient referrals for mental health follow-up including medication management/psychotherapy   I certify that inpatient services furnished can reasonably be expected to improve the patient's condition.    Nicholes Rough, NP 12/22/2022, 2:05 PMPatient ID: Jacob Contreras, male   DOB: [birth year], 32 y.o.   MRN: [id number] Patient ID: Jacob Contreras, male   DOB: [birth year], 32 y.o.   MRN: [id number]

## 2022-12-22 NOTE — Group Note (Signed)
Date:  12/22/2022 Time:  9:49 AM  Group Topic/Focus:  Goals Group:   The focus of this group is to help patients establish daily goals to achieve during treatment and discuss how the patient can incorporate goal setting into their daily lives to aide in recovery. Orientation:   The focus of this group is to educate the patient on the purpose and policies of crisis stabilization and provide a format to answer questions about their admission.  The group details unit policies and expectations of patients while admitted.    Participation Level:  Active  Participation Quality:  Appropriate  Affect:  Appropriate  Cognitive:  Appropriate  Insight: Appropriate  Engagement in Group:  Engaged  Modes of Intervention:  Discussion  Additional Comments:  Pt was engaged and appropriate for group  Garvin Fila 12/22/2022, 9:49 AM

## 2022-12-22 NOTE — Group Note (Unsigned)
Date:  12/22/2022 Time:  9:45 AM  Group Topic/Focus:  Goals Group:   The focus of this group is to help patients establish daily goals to achieve during treatment and discuss how the patient can incorporate goal setting into their daily lives to aide in recovery. Orientation:   The focus of this group is to educate the patient on the purpose and policies of crisis stabilization and provide a format to answer questions about their admission.  The group details unit policies and expectations of patients while admitted.     Participation Level:  {BHH PARTICIPATION [id number]  Participation Quality:  {BHH PARTICIPATION QUALITY:22265}  Affect:  {BHH AFFECT:22266}  Cognitive:  {BHH COGNITIVE:22267}  Insight: {BHH Insight2:20797}  Engagement in Group:  {BHH ENGAGEMENT IN BP:8198245  Modes of Intervention:  {BHH MODES OF INTERVENTION:22269}  Additional Comments:  ***  Garvin Fila 12/22/2022, 9:45 AM

## 2022-12-22 NOTE — Group Note (Unsigned)
Date:  12/22/2022 Time:  4:34 PM  Group Topic/Focus:  Healthy Communication:   The focus of this group is to discuss communication, barriers to communication, as well as healthy ways to communicate with others. Making Healthy Choices:   The focus of this group is to help patients identify negative/unhealthy choices they were using prior to admission and identify positive/healthier coping strategies to replace them upon discharge.     Participation Level:  {BHH PARTICIPATION WO:[id number]  Participation Quality:  {BHH PARTICIPATION QUALITY:22265}  Affect:  {BHH AFFECT:22266}  Cognitive:  {BHH COGNITIVE:22267}  Insight: {BHH Insight2:20797}  Engagement in Group:  {BHH ENGAGEMENT IN BP:8198245  Modes of Intervention:  {BHH MODES OF INTERVENTION:22269}  Additional Comments:  ***  Garvin Fila 12/22/2022, 4:34 PM

## 2022-12-22 NOTE — Group Note (Signed)
Recreation Therapy Group Note   Group Topic:Leisure Education  Group Date: 12/22/2022 Start Time: 1400 End Time: [id number] Facilitators: Cederic Mozley-McCall, LRT,CTRS Location: [address] Dayroom   Activity Description/Intervention: Therapeutic Drumming. Patients with peers and staff were given the opportunity to engage in a leader facilitated [place] with staff from the [employer], in partnership with [employer]. Nurse, adult and trained [employer], Devin Going leading with LRT observing and documenting intervention and pt response. This evidenced-based practice targets 7 areas of health and wellbeing in the human experience including: stress-reduction, exercise, self-expression, camaraderie/support, nurturing, spirituality, and music-making (leisure).   Goal Area(s) Addresses:  Patient will engage in pro-social way in music group.  Patient will follow directions of drum leader on the first prompt. Patient will demonstrate no behavioral issues during group.  Patient will identify if a reduction in stress level occurs as a result of participation in therapeutic drum circle.     Affect/Mood: Appropriate   Participation Level: Engaged   Participation Quality: Independent   Behavior: Appropriate   Speech/Thought Process: Focused   Insight: Good   Judgement: Good   Modes of Intervention: Nurse, adult   Patient Response to Interventions:  Engaged   Education Outcome:  Acknowledges education and In group clarification offered    Clinical Observations/Individualized Feedback: Pt was active and engaged in their participation of session activities and group discussion.   Plan: Continue to engage patient in RT group sessions 2-3x/week.   Jacob Contreras, LRT,CTRS 12/22/2022 3:29 PM

## 2022-12-22 NOTE — Progress Notes (Signed)
D: Patient alert and oriented, able to make needs known. Denies SI/HI, AVH at present. Denies pain at present. Patient goal today "read my bible,laugh more than yesterday." Rates depression 0/10, hopelessness 0/10, and anxiety 0/10. Patient reports energy level as normal. He reports she slept good last night. Patient does not request any PRN medication at this time.   A: Scheduled medications administered to patient per MD order. Support and encouragement provided. Routine safety checks conducted every fifteen minutes. Patient informed to notify staff with problems or concerns. Frequent verbal contact made.   R: No adverse drug reactions noted. Patient contracts for safety at this time. Patient is compliant with medications and treatment plan. Patient receptive, calm and cooperative. Patient interacts with others appropriately on unit at present. Patient remains safe at present.

## 2022-12-23 DIAGNOSIS — F3181 Bipolar II disorder: Principal | ICD-10-CM

## 2022-12-23 MED ORDER — AMLODIPINE BESYLATE 5 MG PO TABS: 5.0000 mg | ORAL_TABLET | Freq: Every day | ORAL | 0 refills | Status: DC

## 2022-12-23 MED ORDER — MIRTAZAPINE 7.5 MG PO TABS: 7.5000 mg | ORAL_TABLET | Freq: Every day | ORAL | 0 refills | Status: DC

## 2022-12-23 MED ORDER — ESCITALOPRAM OXALATE 10 MG PO TABS: 10.0000 mg | ORAL_TABLET | Freq: Every day | ORAL | 0 refills | Status: DC

## 2022-12-23 NOTE — Progress Notes (Signed)
Discharge note: RN met with pt and reviewed pt's discharge instructions. Pt verbalized understanding of discharge instructions and pt did not have any questions. RN reviewed and provided pt with a copy of  Suicide Safety Plan, SRA, AVS and Transition Record. RN returned pt's belongings to pt.Pt denied SI/HI/AVH and voiced no concerns. Pt was appreciative of the care pt received at [facility]. Patient discharged to the lobby without incident.

## 2022-12-23 NOTE — Progress Notes (Signed)
  [facility] Adult Case Management Discharge Plan :  Will you be returning to the same living situation after discharge:  Yes,  Home with ex-girlfriend  At discharge, do you have transportation home?: Yes,  Ex-girlfriend  Do you have the ability to pay for your medications: Yes,  [employer] Benefits   Release of information consent forms completed and in the chart;  Patient's signature needed at discharge.  Patient to Follow up at:  Follow-up Information     Clinic, [place]. Go on 01/07/2023.   Why: You have a hospital follow up appointment for therapy and medication management services on 01/07/23 at 11:00 am.  This appointment will be held in person. Contact information: [place] [phone]                 Next level of care provider has access to [place] and Suicide Prevention discussed: Yes,  With patient and ex-girlfriend      Has patient been referred to the Quitline?: N/A patient is not a smoker  Patient has been referred for addiction treatment: [place], [place] 12/23/2022, 9:24 AM

## 2022-12-23 NOTE — B[facility] Group Notes (Signed)
Adult Psychoeducational Group Note  Date:  12/23/2022 Time:  9:46 AM  Group Topic/Focus:  Goals Group:   The focus of this group is to help patients establish daily goals to achieve during treatment and discuss how the patient can incorporate goal setting into their daily lives to aide in recovery.  Participation Level:  Active  Participation Quality:  Appropriate  Affect:  Appropriate  Cognitive:  Appropriate  Insight: Appropriate  Engagement in Group:  Engaged  Modes of Intervention:  Education  Additional Comments:  PT Participated in group.  [name] 12/23/2022, 9:46 AM

## 2022-12-23 NOTE — Discharge Summary (Signed)
Physician Discharge Summary Note  Patient:  Jacob Contreras is an 32 y.o., male MRN:  [id number] DOB:  1991-01-17 Patient phone:  [phone] (home)  Patient address:   [place] [address],  Total Time spent with patient: 45 minutes  Date of Admission:  12/18/2022 Date of Discharge: 12/23/2022  Reason for Admission:  Jacob Contreras is a 32 year old African-American male with prior mental health diagnoses of MDD & PTSD who was taken to the [place] ED by EMS on 03/1 after taking 110 mg of Zyprexa in a suicide attempt in the context of several stressors including relationship issues and financial issues.  Patient was medically stabilized, and transferred under IVC status to this behavioral health Hospital for treatment and stabilization of his mental status.    Principal Problem: Bipolar 2 disorder, major depressive episode [facility]) Discharge Diagnoses: Principal Problem:   Bipolar 2 disorder, major depressive episode ([place]) Active Problems:   Grief   Anxiety state   Insomnia   Past Psychiatric History:  Previous Psych Diagnoses: Bipolar disorder, PTSD   Prior inpatient treatment: -2019 at [facility] for MDD -2020 at [facility] after an overdose in a suicide attempt -2023 at [facility] for depressive symptoms   Current/prior [facility] [place] (Dr  Baron Hamper).   Prior rehab hx: Denies Psychotherapy hx: Reports that he is about to establish care with "Dr. Felton Clinton"    History of suicide attempts: As above   History of homicide or aggression: Denies   Psychiatric medication history: Most recently was on Zyprexa and Vistaril started in 2017, states medications were helping prior to overdose.  Has had trials of BuSpar & Abilify from 2019-2020. Melatonin and Abilify for 3 months last year and both stopped working. Trazodone in the past caused Priapism.    Psychiatric medication compliance history: Reports compliance Neuromodulation history:  Denies [facility]  Past Medical History:  Past Medical History:  Diagnosis Date   Hypertension    No past surgical history on file.  Family History: No family history on file. Family Psychiatric  History:  Psych: Older brother with schizophrenia, twin brother with questionable CTE. Psych Rx: Unsure SA/HA: Uncle completed suicide by overdosing on cough syrup after he lost a set of triplets shortly after they were born. Substance use family hx: Denies Social History:  Social History   Substance and Sexual Activity  Alcohol Use Never     Social History   Substance and Sexual Activity  Drug Use Never    Social History   Socioeconomic History   Marital status: Single    Spouse name: Not on file   Number of children: Not on file   Years of education: Not on file   Highest education level: Not on file  Occupational History   Not on file  Tobacco Use   Smoking status: Never   Smokeless tobacco: Never  Substance and Sexual Activity   Alcohol use: Never   Drug use: Never   Sexual activity: Not on file  Other Topics Concern   Not on file  Social History Narrative   Not on file   Social Determinants of Health   Financial Resource Strain: Not on file  Food Insecurity: Not on file  Transportation Needs: Not on file  Physical Activity: Not on file  Stress: Not on file  Social Connections: Not on file   Patient reports that he is single, heterosexual, currently does not have a job,  finances is a stressor, he does not have a source of transportation.  States that his aunt is his support person, reports that he has 1 older brother, and 4 younger siblings, states that he is a twin.   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  States only drank half a bottle of wine with Zyprexa most recently an overdose attempt.  Denies  that it was an attempt to self-harm.   Tobacco: Denies Illicit drugs: Denies Rx drug abuse: Denies Rehab hx: Denies  Hospital Course:   During the patient's hospitalization, patient had extensive initial psychiatric evaluation, and follow-up psychiatric evaluations every day.   Psychiatric diagnoses provided upon initial assessment: Bipolar 2 disorder, major depressive episode ([place])    Patient's psychiatric medications were adjusted on admission: Start Remeron 7.5 mg nightly for sleep -Start Lexapro 5 mg on 3/4 followed by 10 mg daily on 3/5 for MDD and anxiety -Start Norvasc 5 mg daily for hypertension -Start clonidine 0.1 mg as needed twice daily for SBP over 160 or DBP over 100 -Continue agitation protocol: Benadryl/Ativan/Haldol.  Please see the [facility] for complete order.   PRNS -Continue Hydroxyzine 25 mg every 6 hours PRN -Continue Tylenol 650 mg every 6 hours PRN for mild pain -Continue Maalox 30 mg every 4 hrs PRN for indigestion -Continue Imodium 2-4 mg as needed for diarrhea -Continue Milk of Magnesia as needed every 6 hrs for constipation -Continue Zofran disintegrating tabs every 6 hrs PRN for nausea    During the hospitalization, other adjustments were made to the patient's psychiatric medication regimen: no changes   Patient's care was discussed during the interdisciplinary team meeting every day during the hospitalization.   The patient denies having side effects to prescribed psychiatric medication.   Gradually, patient started adjusting to milieu. The patient was evaluated each day by a clinical provider to ascertain response to treatment. Improvement was noted by the patient's report of decreasing symptoms, improved sleep and appetite, affect, medication tolerance, behavior, and participation in unit programming.  Patient was asked each day to complete a self inventory noting mood, mental status, pain, new symptoms, anxiety and concerns.     Symptoms were reported as  significantly decreased or resolved completely by discharge.    On day of discharge, patient was evaluated on 12/23/22 the patient reports that their mood is stable. The patient denied having suicidal thoughts for more than 48 hours prior to discharge.  Patient denies having homicidal thoughts.  Patient denies having auditory hallucinations.  Patient denies any visual hallucinations or other symptoms of psychosis. The patient was motivated to continue taking medication with a goal of continued improvement in mental health.    The patient reports their target psychiatric symptoms of increased depression and si responded well to the psychiatric medications, and the patient reports overall benefit other psychiatric hospitalization. Supportive psychotherapy was provided to the patient. The patient also participated in regular group therapy while hospitalized. Coping skills, problem solving as well as relaxation therapies were also part of the unit programming.   Labs were reviewed with the patient, and abnormal results were discussed with the patient.   The patient is able to verbalize their individual safety plan to this provider.   Behavioral Events: none   Restraints: none   Groups:attended and participated   Medications Changes: as above   Sleep  Sleep:Sleep: Good  Physical Findings: AIMS:  , ,  ,  ,    CIWA:    COWS:     Musculoskeletal: Strength & Muscle Tone: within normal limits Gait & Station: normal Patient leans: N/A   Psychiatric Specialty Exam:  General Appearance: appears at stated age, fairly dressed and groomed  Behavior: pleasant and cooperative  Psychomotor Activity:No psychomotor agitation or retardation noted   Eye Contact: good Speech: normal amount, tone, volume and latency   Mood: euthymic Affect: congruent, pleasant and interactive  Thought Process: linear, goal directed, no circumstantial or tangential thought process noted, no racing thoughts or  flight of ideas Descriptions of Associations: intact Thought Content: Hallucinations: denies AH, VH , does not appear responding to stimuli Delusions: No paranoia or other delusions noted Suicidal Thoughts: denies SI, intention, plan  Homicidal Thoughts: denies HI, intention, plan   Alertness/Orientation: alert and fully oriented  Insight: fair, improved Judgment: fair, improved  Memory: intact  Executive Functions  Concentration: intact  Attention Span: Fair Recall: intact Fund of Knowledge: fair   Assets  Assets: Communication Skills   Sleep Sleep: Sleep: Good    Physical Exam:  Physical Exam Vitals and nursing note reviewed.  Constitutional:      Appearance: Normal appearance.  HENT:     Head: Normocephalic and atraumatic.     Nose: Nose normal.  Eyes:     Pupils: Pupils are equal, round, and reactive to light.  Pulmonary:     Effort: Pulmonary effort is normal.  Musculoskeletal:        General: Normal range of motion.  Neurological:     General: No focal deficit present.     Mental Status: He is alert and oriented to person, place, and time. Mental status is at baseline.  Psychiatric:        Mood and Affect: Mood normal.        Behavior: Behavior normal.        Thought Content: Thought content normal.        Judgment: Judgment normal.    ROS Blood pressure (!) 117/92, pulse 85, temperature 98.6 F (37 C), temperature source Oral, resp. rate 16, SpO2 98 %. There is no height or weight on file to calculate BMI.   Social History   Tobacco Use  Smoking Status Never  Smokeless Tobacco Never   Tobacco Cessation:  N/A, patient does not currently use tobacco products   Blood Alcohol level:  Lab Results  Component Value Date   ETH <10 [id number]    Metabolic Disorder Labs:  Lab Results  Component Value Date   HGBA1C 5.7 (H) 12/20/2022   MPG 117 12/20/2022   No results found for: "PROLACTIN" Lab Results  Component Value Date   CHOL 201  (H) 12/20/2022   TRIG 137 12/20/2022   HDL 42 12/20/2022   CHOLHDL 4.8 12/20/2022   VLDL 27 12/20/2022   LDLCALC 132 (H) 12/20/2022    See Psychiatric Specialty Exam and Suicide Risk Assessment completed by Attending Physician prior to discharge.  Discharge destination:  Home  Is patient on multiple antipsychotic therapies at discharge:  No   Has Patient had three or more failed trials of antipsychotic monotherapy by history:  No  Recommended Plan for Multiple Antipsychotic Therapies: NA  Discharge Instructions     Diet - low sodium heart healthy   Complete by: As directed    Increase activity slowly   Complete by: As directed       Allergies as of 12/23/2022  Reactions   Mushroom Extract Complex Diarrhea   Trazodone And Nefazodone Other (See Comments)   Priapism        Medication List     STOP taking these medications    hydrOXYzine 25 MG capsule Commonly known as: VISTARIL   OLANZapine 20 MG tablet Commonly known as: ZYPREXA       TAKE these medications      Indication  amLODipine 5 MG tablet Commonly known as: NORVASC Take 1 tablet (5 mg total) by mouth daily. Start taking on: December 24, 2022  Indication: High Blood Pressure Disorder   escitalopram 10 MG tablet Commonly known as: LEXAPRO Take 1 tablet (10 mg total) by mouth daily. Start taking on: December 24, 2022  Indication: Generalized Anxiety Disorder, Major Depressive Disorder   mirtazapine 7.5 MG tablet Commonly known as: REMERON Take 1 tablet (7.5 mg total) by mouth at bedtime.  Indication: sleep        Follow-up Information     Clinic, [facility]. Go on 01/07/2023.   Why: You have a hospital follow up appointment for therapy and medication management services on 01/07/23 at 11:00 am.  This appointment will be held in person. Contact information: [place] [address] [phone]                 Discharge recommendations:    Activity: as tolerated  Diet: heart healthy  # It is recommended to the patient to continue psychiatric medications as prescribed, after discharge from the hospital.     # It is recommended to the patient to follow up with your outpatient psychiatric provider and PCP.   # It was discussed with the patient, the impact of alcohol, drugs, tobacco have been there overall psychiatric and medical wellbeing, and total abstinence from substance use was recommended the patient.ed.   # Prescriptions provided or sent directly to preferred pharmacy at discharge. Patient agreeable to plan. Given opportunity to ask questions. Appears to feel comfortable with discharge.    # In the event of worsening symptoms, the patient is instructed to call the crisis hotline, 911 and or go to the nearest ED for appropriate evaluation and treatment of symptoms. To follow-up with primary care provider for other medical issues, concerns and or health care needs   # Patient was discharged home with a plan to follow up as noted above.  -Follow-up with outpatient primary care doctor and other specialists -for management of chronic medical disease, including: patient was instructed to follow with pcp for hypertension, he agrees.    Patient agrees with D/C instructions and plan.   The patient received suicide prevention pamphlet:  Yes Belongings returned:  Clothing and Valuables  Total Time Spent in Direct Patient Care:  I personally spent 45 minutes on the unit in direct patient care. The direct patient care time included face-to-face time with the patient, reviewing the patient's chart, communicating with other professionals, and coordinating care. Greater than 50% of this time was spent in counseling or coordinating care with the patient regarding goals of hospitalization, psycho-education, and discharge planning needs.    SignedDian Situ, MD 12/23/2022, 11:22 AM

## 2022-12-23 NOTE — [facility] Suicide Risk Assessment (Signed)
Jacob Contreras Discharge Suicide Risk Assessment   Principal Problem: Bipolar 2 disorder, major depressive episode ([place]) Discharge Diagnoses: Principal Problem:   Bipolar 2 disorder, major depressive episode ([place]) Active Problems:   Grief   Anxiety state   Insomnia   Total Time spent with patient: 45 minutes  Reason for admission: Jacob Contreras is a 32 year old African-American male with prior mental health diagnoses of MDD & PTSD who was taken to the [facility] ED by EMS on 03/1 after taking 110 mg of Zyprexa in a suicide attempt in the context of several stressors including relationship issues and financial issues.  Patient was medically stabilized, and transferred under IVC status to this behavioral health Hospital for treatment and stabilization of his mental status.   PTA Medications:  Medications Prior to Admission  Medication Sig Dispense Refill Last Dose   hydrOXYzine (VISTARIL) 25 MG capsule Take 25 mg by mouth 2 (two) times daily as needed for anxiety.     12/16/2022   OLANZapine (ZYPREXA) 20 MG tablet Take 10 mg by mouth at bedtime.     12/16/2022    Hospital Course:   During the patient's hospitalization, patient had extensive initial psychiatric evaluation, and follow-up psychiatric evaluations every day.  Psychiatric diagnoses provided upon initial assessment: Bipolar 2 disorder, major depressive episode ([place])   Patient's psychiatric medications were adjusted on admission: Start Remeron 7.5 mg nightly for sleep -Start Lexapro 5 mg on 3/4 followed by 10 mg daily on 3/5 for MDD and anxiety -Start Norvasc 5 mg daily for hypertension -Start clonidine 0.1 mg as needed twice daily for SBP over 160 or DBP over 100 -Continue agitation protocol: Benadryl/Ativan/Haldol.  Please see the [facility] for complete order.   PRNS -Continue Hydroxyzine 25 mg every 6 hours PRN -Continue Tylenol 650 mg every 6 hours PRN for mild pain -Continue Maalox 30 mg every 4 hrs PRN for indigestion -Continue  Imodium 2-4 mg as needed for diarrhea -Continue Milk of Magnesia as needed every 6 hrs for constipation -Continue Zofran disintegrating tabs every 6 hrs PRN for nausea   During the hospitalization, other adjustments were made to the patient's psychiatric medication regimen: no changes  Patient's care was discussed during the interdisciplinary team meeting every day during the hospitalization.  The patient denies having side effects to prescribed psychiatric medication.  Gradually, patient started adjusting to milieu. The patient was evaluated each day by a clinical provider to ascertain response to treatment. Improvement was noted by the patient's report of decreasing symptoms, improved sleep and appetite, affect, medication tolerance, behavior, and participation in unit programming.  Patient was asked each day to complete a self inventory noting mood, mental status, pain, new symptoms, anxiety and concerns.    Symptoms were reported as significantly decreased or resolved completely by discharge.   On day of discharge, patient was evaluated on 12/23/22 the patient reports that their mood is stable. The patient denied having suicidal thoughts for more than 48 hours prior to discharge.  Patient denies having homicidal thoughts.  Patient denies having auditory hallucinations.  Patient denies any visual hallucinations or other symptoms of psychosis. The patient was motivated to continue taking medication with a goal of continued improvement in mental health.   The patient reports their target psychiatric symptoms of increased depression and si responded well to the psychiatric medications, and the patient reports overall benefit other psychiatric hospitalization. Supportive psychotherapy was provided to the patient. The patient also participated in regular group therapy while hospitalized. Coping skills, problem solving  as well as relaxation therapies were also part of the unit programming.  Labs were  reviewed with the patient, and abnormal results were discussed with the patient.  The patient is able to verbalize their individual safety plan to this provider.  Behavioral Events: none  Restraints: none  Groups:attended and participated  Medications Changes: as above  Sleep  Sleep:Sleep: Good   Musculoskeletal: Strength & Muscle Tone: within normal limits Gait & Station: normal Patient leans: N/A  Psychiatric Specialty Exam  General Appearance: appears at stated age, fairly dressed and groomed  Behavior: pleasant and cooperative  Psychomotor Activity:No psychomotor agitation or retardation noted   Eye Contact: good Speech: normal amount, tone, volume and latency   Mood: euthymic Affect: congruent, pleasant and interactive  Thought Process: linear, goal directed, no circumstantial or tangential thought process noted, no racing thoughts or flight of ideas Descriptions of Associations: intact Thought Content: Hallucinations: denies AH, VH , does not appear responding to stimuli Delusions: No paranoia or other delusions noted Suicidal Thoughts: denies SI, intention, plan  Homicidal Thoughts: denies HI, intention, plan   Alertness/Orientation: alert and fully oriented  Insight: fair, improved Judgment: fair, improved  Memory: intact  Executive Functions  Concentration: intact  Attention Span: Fair Recall: intact Fund of Knowledge: fair   Community education officer  Concentration: intact Attention Span: Fair Recall: intact Fund of Knowledge: fair   Assets  Assets: Armed forces logistics/support/administrative officer   Physical Exam: Physical Exam ROS Blood pressure (!) 117/92, pulse 85, temperature 98.6 F (37 C), temperature source Oral, resp. rate 16, SpO2 98 %. There is no height or weight on file to calculate BMI.  Mental Status Per Nursing Assessment::   On Admission:  Self-harm behaviors, Self-harm thoughts  Demographic Factors:  Male and Low socioeconomic status  Loss  Factors: NA  Historical Factors: Prior suicide attempts  Risk Reduction Factors:   Positive social support, Positive therapeutic relationship, and Positive coping skills or problem solving skills  Continued Clinical Symptoms: improved during hospital stay Depression:   Anhedonia Hopelessness Insomnia  Cognitive Features That Contribute To Risk:  None    Suicide Risk:  Minimal: No identifiable suicidal ideation.  Patients presenting with no risk factors but with morbid ruminations; may be classified as minimal risk based on the severity of the depressive symptoms   Follow-up Information     Clinic, [facility]. Go on 01/07/2023.   Why: You have a hospital follow up appointment for therapy and medication management services on 01/07/23 at 11:00 am.  This appointment will be held in person. Contact information: [place] [address] [phone]                 Plan Of Care/Follow-up recommendations:   Discharge recommendations:     Activity: as tolerated  Diet: heart healthy  # It is recommended to the patient to continue psychiatric medications as prescribed, after discharge from the hospital.     # It is recommended to the patient to follow up with your outpatient psychiatric provider and PCP.   # It was discussed with the patient, the impact of alcohol, drugs, tobacco have been there overall psychiatric and medical wellbeing, and total abstinence from substance use was recommended the patient.ed.   # Prescriptions provided or sent directly to preferred pharmacy at discharge. Patient agreeable to plan. Given opportunity to ask questions. Appears to feel comfortable with discharge.    # In the event of worsening symptoms, the patient is instructed to call the  crisis hotline, 911 and or go to the nearest ED for appropriate evaluation and treatment of symptoms. To follow-up with primary care provider for other medical issues,  concerns and or health care needs   # Patient was discharged home with a plan to follow up as noted above.  -Follow-up with outpatient primary care doctor and other specialists -for management of chronic medical disease, including: patient was instructed to follow with pcp for hypertension, he agrees.   Patient agrees with D/C instructions and plan.  The patient received suicide prevention pamphlet:  Yes Belongings returned:  Clothing and Valuables  Total Time Spent in Direct Patient Care:  I personally spent 45 minutes on the unit in direct patient care. The direct patient care time included face-to-face time with the patient, reviewing the patient's chart, communicating with other professionals, and coordinating care. Greater than 50% of this time was spent in counseling or coordinating care with the patient regarding goals of hospitalization, psycho-education, and discharge planning needs.   Megen Madewell 12/23/2022, 10:31 AM   Lorretta Kerce Winfred Leeds, MD 12/23/2022, 10:31 AM

## 2022-12-23 NOTE — Group Note (Signed)
LCSW Group Therapy Note   Group Date: 12/23/2022 Start Time: 1100 End Time: 1200  Type of Therapy and Topic:  Group Therapy:  Self-Care after Hospitalization  Participation Level:  Active   Description of Group This process group involved patients discussing how they plan to take care of themselves in a better manner when they get home from the hospital.  The group started with patients listing one healthy self-care they hope to engage in at discharge that they did not use prior to admission.  We discussed a variety of other means of self-care which had a large range from hygiene activities to eating to setting boundaries to participating in peer support groups.  The primary focus by CSW was to point out commonalities.  When there were participants who stated everyone else can get help, but they themselves are "beyond help," this was discussed in detail and this belief was gently challenged.  Finally, it was announced that immediately following group was to be "Hygiene Hour" where everyone would go to their rooms and take care of their personal hygiene.  This was met with wide acceptance.  Therapeutic Goals Patient will identify and describe one self-care activity to deliberately plan to use upon hospital discharge. Patient will participate in generating additional ideas about healthy self-care options when they return to the community. Patients will be supportive of one another and receive support from others. Patients will be challenged to realize that they are not "beyond help" any more than other participants in the room are.  Summary of Patient Progress:  The Pt attended group and remained there the entire time.  The Pt accepted all worksheets and materials provided.  The Pt demonstrated understanding of the topic being discussed by asking questions and sharing their thoughts and feelings with their peers.  The Pt was appropriate with peers and staff throughout the group session.     Therapeutic Modalities Brief Solution-Focused Therapy Psychoeducation  Darleen Crocker, [place] 12/23/2022  1:10 PM

## 2022-12-23 NOTE — Progress Notes (Signed)
   12/23/22 0600  15 Minute Checks  Location Bedroom  Visual Appearance Calm  Behavior Sleeping  Sleep (Behavioral Health Patients Only)  Calculate sleep? (Click Yes once per 24 hr at 0600 safety check) Yes  Documented sleep last 24 hours 7.75

## 2023-01-04 ENCOUNTER — Encounter (HOSPITAL_COMMUNITY): Payer: Self-pay

## 2023-01-04 ENCOUNTER — Other Ambulatory Visit: Payer: Self-pay

## 2023-01-04 ENCOUNTER — Emergency Department (HOSPITAL_COMMUNITY)
Admission: EM | Admit: 2023-01-04 | Discharge: 2023-01-04 | Disposition: A | Discharge status: Home / Self Care | Payer: No Typology Code available for payment source | Attending: Emergency Medicine | Admitting: Emergency Medicine

## 2023-01-04 DIAGNOSIS — Z79899 Other long term (current) drug therapy: Secondary | ICD-10-CM | POA: Diagnosis not present

## 2023-01-04 DIAGNOSIS — I10 Essential (primary) hypertension: Secondary | ICD-10-CM | POA: Diagnosis not present

## 2023-01-04 DIAGNOSIS — Z1152 Encounter for screening for COVID-19: Secondary | ICD-10-CM | POA: Diagnosis not present

## 2023-01-04 DIAGNOSIS — R5383 Other fatigue: Secondary | ICD-10-CM | POA: Insufficient documentation

## 2023-01-04 LAB — COMPREHENSIVE METABOLIC PANEL
ALT: 41 U/L (ref 0–44)
AST: 26 U/L (ref 15–41)
Albumin: 4.5 g/dL (ref 3.5–5.0)
Alkaline Phosphatase: 56 U/L (ref 38–126)
Anion gap: 9 (ref 5–15)
BUN: 19 mg/dL (ref 6–20)
CO2: 21 mmol/L — ABNORMAL LOW (ref 22–32)
Calcium: 9 mg/dL (ref 8.9–10.3)
Chloride: 108 mmol/L (ref 98–111)
Creatinine, Ser: 0.84 mg/dL (ref 0.61–1.24)
GFR, Estimated: >60 mL/min (ref >60)
Glucose, Bld: 101 mg/dL — ABNORMAL HIGH (ref 70–99)
Potassium: 3.8 mmol/L (ref 3.5–5.1)
Sodium: 138 mmol/L (ref 135–145)
Total Bilirubin: 0.4 mg/dL (ref 0.3–1.2)
Total Protein: 7.4 g/dL (ref 6.5–8.1)

## 2023-01-04 LAB — CBC WITH DIFFERENTIAL/PLATELET
Abs Immature Granulocytes: 0.02 10*3/uL (ref 0.00–0.07)
Basophils Absolute: 0 10*3/uL (ref 0.0–0.1)
Basophils Relative: 0 %
Eosinophils Absolute: 0.2 10*3/uL (ref 0.0–0.5)
Eosinophils Relative: 3 %
HCT: 41 % (ref 39.0–52.0)
Hemoglobin: 13.6 g/dL (ref 13.0–17.0)
Immature Granulocytes: 0 %
Lymphocytes Relative: 31 %
Lymphs Abs: 1.8 10*3/uL (ref 0.7–4.0)
MCH: 28.7 pg (ref 26.0–34.0)
MCHC: 33.2 g/dL (ref 30.0–36.0)
MCV: 86.5 fL (ref 80.0–100.0)
Monocytes Absolute: 0.5 10*3/uL (ref 0.1–1.0)
Monocytes Relative: 8 %
Neutro Abs: 3.3 10*3/uL (ref 1.7–7.7)
Neutrophils Relative %: 58 %
Platelets: 333 10*3/uL (ref 150–400)
RBC: 4.74 MIL/uL (ref 4.22–5.81)
RDW: 13.6 % (ref 11.5–15.5)
WBC: 5.8 10*3/uL (ref 4.0–10.5)
nRBC: 0 % (ref 0.0–0.2)

## 2023-01-04 LAB — RAPID URINE DRUG SCREEN, HOSP PERFORMED
Amphetamines: NOT DETECTED
Barbiturates: NOT DETECTED
Benzodiazepines: NOT DETECTED
Cocaine: NOT DETECTED
Opiates: NOT DETECTED
Tetrahydrocannabinol: NOT DETECTED

## 2023-01-04 LAB — RESP PANEL BY RT-PCR (RSV, FLU A&B, COVID)  RVPGX2
Influenza A by PCR: NEGATIVE
Influenza B by PCR: NEGATIVE
Resp Syncytial Virus by PCR: NEGATIVE
SARS Coronavirus 2 by RT PCR: NEGATIVE

## 2023-01-04 LAB — ETHANOL: Alcohol, Ethyl (B): <10 mg/dL (ref <10)

## 2023-01-04 NOTE — ED Triage Notes (Signed)
C/o fatigue, weakness, lightheaded, and bruising easily.  Started Amlodipine, lexapro, and Remeron  2 weeks ago.  Recently d/c for SI attempt.

## 2023-01-04 NOTE — ED Provider Notes (Signed)
[place] EMERGENCY DEPARTMENT AT [facility] Provider Note   CSN: [id number] Arrival date & time: 01/04/23  1431     History  Chief Complaint  Patient presents with   Fatigue    Jacob Contreras is a 32 y.o. male.  32 year old male with prior medical history as detailed below presents for evaluation.  Patient reports that he was hospitalized for psychiatric issues approximately 2 weeks ago.  Patient was then started on escitalopram 10 mg, mirtazapine 7.5 mg, and amlodipine 5 mg.  Patient reports increasing symptoms of fatigue.  Patient associates his fatigue with recent initiation of medications.  He feels that the most likely suspect is the amlodipine that he is taking.  Patient does not check his blood pressures at home.  He denies associated chest pain, shortness of breath, headache, visual change, focal weakness.  Patient has already scheduled follow-up with his [place] providers on Friday of this week.  He plans on discussing these issues with his [place] providers.  The history is provided by the patient and medical records.       Home Medications Prior to Admission medications   Medication Sig Start Date End Date Taking? Authorizing Provider  amLODipine (NORVASC) 5 MG tablet Take 1 tablet (5 mg total) by mouth daily. 12/24/22   Dian Situ, MD  escitalopram (LEXAPRO) 10 MG tablet Take 1 tablet (10 mg total) by mouth daily. 12/24/22   Dian Situ, MD  mirtazapine (REMERON) 7.5 MG tablet Take 1 tablet (7.5 mg total) by mouth at bedtime. 12/23/22   Dian Situ, MD      Allergies    Mushroom extract complex and Trazodone and nefazodone    Review of Systems   Review of Systems  Physical Exam Updated Vital Signs BP 124/82   Pulse 77   Temp 98.1 F (36.7 C)   Resp 19   Ht 5\' 9"  (1.753 m)   Wt 87 kg   SpO2 97%   BMI 28.32 kg/m  Physical Exam Vitals and nursing note reviewed.  Constitutional:      General: He is not in acute distress.    Appearance: Normal  appearance. He is well-developed.  HENT:     Head: Normocephalic and atraumatic.  Eyes:     Conjunctiva/sclera: Conjunctivae normal.     Pupils: Pupils are equal, round, and reactive to light.  Cardiovascular:     Rate and Rhythm: Normal rate and regular rhythm.     Heart sounds: Normal heart sounds.  Pulmonary:     Effort: Pulmonary effort is normal. No respiratory distress.     Breath sounds: Normal breath sounds.  Abdominal:     General: There is no distension.     Palpations: Abdomen is soft.     Tenderness: There is no abdominal tenderness.  Musculoskeletal:        General: No deformity. Normal range of motion.     Cervical back: Normal range of motion and neck supple.  Skin:    General: Skin is warm and dry.  Neurological:     General: No focal deficit present.     Mental Status: He is alert and oriented to person, place, and time.     ED Results / Procedures / Treatments   Labs (all labs ordered are listed, but only abnormal results are displayed) Labs Reviewed  COMPREHENSIVE METABOLIC PANEL - Abnormal; Notable for the following components:      Result Value   CO2 21 (*)    Glucose, Bld  101 (*)    All other components within normal limits  RESP PANEL BY RT-PCR (RSV, FLU A&B, COVID)  [id number]  CBC WITH DIFFERENTIAL/PLATELET  ETHANOL  RAPID URINE DRUG SCREEN, HOSP PERFORMED    EKG EKG Interpretation  Date/Time:  Tuesday January 04 2023 15:23:42 EDT Ventricular Rate:  75 PR Interval:  186 QRS Duration: 82 QT Interval:  371 QTC Calculation: 415 R Axis:   34 Text Interpretation: Sinus rhythm Borderline repolarization abnormality Confirmed by Dene Gentry [phone]) on 01/04/2023 4:32:26 PM  Radiology No results found.  Procedures Procedures    Medications Ordered in ED Medications - No data to display  ED Course/ Medical Decision Making/ A&P                             Medical Decision Making   Medical Screen Complete  This patient presented to the  ED with complaint of fatigue, possible medication side effect.  This complaint involves an extensive number of treatment options. The initial differential diagnosis includes, but is not limited to, metabolic abnormality, etc.  This presentation is: Acute, Chronic, Self-Limited, Previously Undiagnosed, Uncertain Prognosis, Complicated, Systemic Symptoms, and Threat to Life/Bodily Function  Patient is reporting fatigue.  This is a generalized nonspecific complaint.  Patient is convinced that his symptoms could be related to recent medications initiated approximately 2 weeks ago.  Patient feels that his recently started amlodipine is the most likely suspect.  Patient is encouraged by ED workup demonstrates no significant abnormality.  Patient reports that he is going to stop taking his amlodipine until he can follow-up with his [place] providers on Friday.  Patient is strongly encouraged to check his blood pressures at home given that he is stopping his antihypertensive.  Importance of close follow-up is stressed.  Strict return precautions given and understood.  Additional history obtained:  External records from outside sources obtained and reviewed including prior ED visits and prior Inpatient records.    Lab Tests:  I ordered and personally interpreted labs.  The pertinent results include: CBC, CMP, urine tox, EtOH, COVID, flu  Cardiac Monitoring:  The patient was maintained on a cardiac monitor.  I personally viewed and interpreted the cardiac monitor which showed an underlying rhythm of: NSR  Problem List / ED Course:  Fatigue, concern for possible medication side effect   Reevaluation:  After the interventions noted above, I reevaluated the patient and found that they have: improved   Disposition:  After consideration of the diagnostic results and the patients response to treatment, I feel that the patent would benefit from close outpatient follow-up.          Final  Clinical Impression(s) / ED Diagnoses Final diagnoses:  Other fatigue  Hypertension, unspecified type    Rx / DC Orders ED Discharge Orders     None         Valarie Merino, MD 01/04/23 1759

## 2023-01-04 NOTE — Discharge Instructions (Signed)
Return for any problem.  [facility]  Check your blood pressure at home as instructed.  If you feel that the amlodipine is causing some of your symptoms, you may stop this until you can follow-up at the [facility] doctors on Friday.  It is imperative that you check your blood pressures at home while not taking the amlodipine.  This will provide more information for your primary care providers so that they can appropriately treat your blood pressure.

## 2023-01-04 NOTE — ED Provider Triage Note (Signed)
Emergency Medicine Provider Triage Evaluation Note  Jacob Contreras , a 32 y.o. male  was evaluated in triage.  Pt complains of fatigue.  Patient reports that he was recently hospitalized 2 weeks ago for.  Suicide attempt with intentional drug overdose.  Since then patient was started on escitalopram 10 mg, mirtazapine 7.5 mg, amlodipine 5 mg he reports he has been feeling increasing fatigue enough to impair with his ability to function daily.  Also reports some associated lightheadedness.  He denies any significant headaches, nausea, vomiting, diarrhea.  Does report that he has had some bruising overlying his right shoulder but no recent injury or event that would have caused this as far as he can recall.  Denies any worsening of mental status with no recent suicidal ideation or suicide attempts.  Review of Systems  Positive: As above Negative: As above  Physical Exam  BP (!) 150/91   Pulse 76   Temp 98.1 F (36.7 C)   Resp 20   Wt 87 kg   SpO2 99%   BMI 28.32 kg/m  Gen:   Awake, no distress Resp:  Normal effort  MSK:   Moves extremities without difficulty  Other:  Some bruising noted over the anterior aspect of the right shoulder and a linear aspect resembling more of a scratch  Medical Decision Making  Medically screening exam initiated at 3:14 PM.  Appropriate orders placed.  Jacob Contreras was informed that the remainder of the evaluation will be completed by another provider, this initial triage assessment does not replace that evaluation, and the importance of remaining in the ED until their evaluation is complete.     Luvenia Heller, PA-C 01/04/23 1517

## 2023-01-15 ENCOUNTER — Ambulatory Visit (HOSPITAL_COMMUNITY)
Admission: EM | Admit: 2023-01-15 | Discharge: 2023-01-16 | Disposition: A | Discharge status: Home / Self Care | Payer: No Typology Code available for payment source | Attending: Nurse Practitioner | Admitting: Nurse Practitioner

## 2023-01-15 DIAGNOSIS — F4321 Adjustment disorder with depressed mood: Secondary | ICD-10-CM | POA: Insufficient documentation

## 2023-01-15 DIAGNOSIS — F4310 Post-traumatic stress disorder, unspecified: Secondary | ICD-10-CM | POA: Diagnosis not present

## 2023-01-15 DIAGNOSIS — Z9151 Personal history of suicidal behavior: Secondary | ICD-10-CM | POA: Insufficient documentation

## 2023-01-15 DIAGNOSIS — F331 Major depressive disorder, recurrent, moderate: Secondary | ICD-10-CM | POA: Insufficient documentation

## 2023-01-15 DIAGNOSIS — Z630 Problems in relationship with spouse or partner: Secondary | ICD-10-CM | POA: Insufficient documentation

## 2023-01-15 DIAGNOSIS — Z1152 Encounter for screening for COVID-19: Secondary | ICD-10-CM | POA: Diagnosis not present

## 2023-01-15 DIAGNOSIS — F4329 Adjustment disorder with other symptoms: Secondary | ICD-10-CM

## 2023-01-15 LAB — COMPREHENSIVE METABOLIC PANEL
ALT: 22 U/L (ref 0–44)
AST: 24 U/L (ref 15–41)
Albumin: 4.1 g/dL (ref 3.5–5.0)
Alkaline Phosphatase: 53 U/L (ref 38–126)
Anion gap: 13 (ref 5–15)
BUN: 11 mg/dL (ref 6–20)
CO2: 23 mmol/L (ref 22–32)
Calcium: 9.7 mg/dL (ref 8.9–10.3)
Chloride: 103 mmol/L (ref 98–111)
Creatinine, Ser: 1.11 mg/dL (ref 0.61–1.24)
GFR, Estimated: >60 mL/min (ref >60)
Glucose, Bld: 69 mg/dL — ABNORMAL LOW (ref 70–99)
Potassium: 4.3 mmol/L (ref 3.5–5.1)
Sodium: 139 mmol/L (ref 135–145)
Total Bilirubin: 0.4 mg/dL (ref 0.3–1.2)
Total Protein: 6.8 g/dL (ref 6.5–8.1)

## 2023-01-15 LAB — LIPID PANEL
Cholesterol: 231 mg/dL — ABNORMAL HIGH (ref 0–200)
HDL: 48 mg/dL (ref >40)
LDL Cholesterol: 167 mg/dL — ABNORMAL HIGH (ref 0–99)
Total CHOL/HDL Ratio: 4.8 RATIO
Triglycerides: 78 mg/dL (ref <150)
VLDL: 16 mg/dL (ref 0–40)

## 2023-01-15 LAB — POCT URINE DRUG SCREEN - MANUAL ENTRY (I-SCREEN)
POC Amphetamine UR: NOT DETECTED
POC Buprenorphine (BUP): NOT DETECTED
POC Cocaine UR: NOT DETECTED
POC Marijuana UR: NOT DETECTED
POC Methadone UR: NOT DETECTED
POC Methamphetamine UR: NOT DETECTED
POC Morphine: NOT DETECTED
POC Oxazepam (BZO): NOT DETECTED
POC Oxycodone UR: NOT DETECTED
POC Secobarbital (BAR): NOT DETECTED

## 2023-01-15 LAB — ETHANOL: Alcohol, Ethyl (B): <10 mg/dL (ref <10)

## 2023-01-15 LAB — POC SARS CORONAVIRUS 2 AG: SARSCOV2ONAVIRUS 2 AG: NEGATIVE

## 2023-01-15 LAB — SARS CORONAVIRUS 2 BY RT PCR: SARS Coronavirus 2 by RT PCR: NEGATIVE

## 2023-01-15 MED ORDER — ALUM & MAG HYDROXIDE-SIMETH 200-200-20 MG/5ML PO SUSP: 30.0000 mL | ORAL | Status: DC | PRN

## 2023-01-15 MED ORDER — ESCITALOPRAM OXALATE 10 MG PO TABS: 10.0000 mg | ORAL_TABLET | Freq: Every day | ORAL | Status: DC

## 2023-01-15 MED ORDER — MAGNESIUM HYDROXIDE 400 MG/5ML PO SUSP: 30.0000 mL | Freq: Every day | ORAL | Status: DC | PRN

## 2023-01-15 MED ORDER — MIRTAZAPINE 7.5 MG PO TABS
7.5000 mg | ORAL_TABLET | Freq: Every day | ORAL | Status: DC
  Filled 2023-01-15: qty 1

## 2023-01-15 MED ORDER — ACETAMINOPHEN 325 MG PO TABS: 650.0000 mg | ORAL_TABLET | Freq: Four times a day (QID) | ORAL | Status: DC | PRN

## 2023-01-15 NOTE — ED Provider Notes (Signed)
[facility] Continuous Assessment Admission H&P  Date: 01/16/23 Patient Name: Jacob Contreras MRN: [id number] Chief Complaint: Argument with girlfriend  Diagnoses:  Final diagnoses:  Moderate episode of recurrent major depressive disorder (HCC)    HPI: Jacob Contreras is a 32 y/o male with a prior mental health history of MDD, PTSD, suicidal attempt presenting to [facility] via [facility] after an argument with his girlfriend.  Patient states that he has girlfriend were arguing about the fact that his girlfriend informed her friends and family about the death of his grandmother which he was not ready to disclose his grandmother to other people or talk about her death.  Patient states that he became upset and angry because people were trying to tell him how to feel and how to grieve.  Patient reports that [facility] was called twice to their apartment tonight and after the second call patient was brought to [facility] to be evaluated.  Patient reports that his grandmother who raised him passed away about a month ago and he has been struggling with being able to process his grief.  Patient states that he feels like he does not have any support from anyone patient and he is grieving by himself.  Patient states that he does not have any family in this state and no support system in place.. Patient has a twin brother, sister ad older brother but reports that he has not close relationship to them.  Patient states he moved to [place] from [place] with his godparents and they had a falling out and he did not have anywhere else to stay so he moved in with his girlfriend.  Patient reports that he and his girlfriend argue constantly patient was most recently hospitalized at [facility] from 12/18/22-12/23/22 for an intentional overdose of zyprexa 110 mg. Patient reports that he has been struggling with depression, anxiety, anger, irritability and poor sleep, decrease focus and concentration, nervous and fidgety. Patient reports  that he has therapy with Wilhemena Durie and had a therapy session today. [facility] remeron 7.5mg  and lexapro 10 mg.   Patient is alert oriented x3, irritable and frustrated with his situation and having to live with his girlfriend out of financial necessity.  Patient initially stated that he wanted to return home tonight and that he would like himself in another bedroom to avoid having more fights with his girlfriend tonight.  Discussed with patient that it would be better for patient to remain at [facility] overnight for continuous assessment to allow patient and girlfriend's anger to dissipate. Patient is in agreement and states that he has a friend from the [employer] that he will call tomorrow and ask if that friend will allow him to stay with him.  Patient denies any SI/HI or AVH currently.   Total Time spent with patient: 30 minutes  Musculoskeletal  Strength & Muscle Tone: within normal limits Gait & Station: normal Patient leans: N/A  Psychiatric Specialty Exam  Presentation General Appearance:  Casual  Eye Contact: Good  Speech: Clear and Coherent  Speech Volume: Normal  Handedness: Right   Mood and Affect  Mood: Anxious; Angry; Depressed  Affect: Congruent   Thought Process  Thought Processes: Coherent  Descriptions of Associations:Intact  Orientation:Full (Time, Place and Person)  Thought Content:WDL  Diagnosis of Schizophrenia or Schizoaffective disorder in past: No   Hallucinations:Hallucinations: None  Ideas of Reference:None  Suicidal Thoughts:Suicidal Thoughts: No  Homicidal Thoughts:Homicidal  Thoughts: No   Sensorium  Memory: Immediate Good; Recent Good; Remote Good  Judgment: Good  Insight: Good   Executive Functions  Concentration: Good  Attention Span: Good  Recall: Good  Fund of Knowledge: Good  Language: Good   Psychomotor Activity   Psychomotor Activity: Psychomotor Activity: Normal   Assets  Assets: Communication Skills; Physical Health; Desire for Improvement   Sleep  Sleep: Sleep: Good   Nutritional Assessment (For OBS and FBC admissions only) Has the patient had a weight loss or gain of 10 pounds or more in the last 3 months?: No Has the patient had a decrease in food intake/or appetite?: No Does the patient have dental problems?: No Does the patient have eating habits or behaviors that may be indicators of an eating disorder including binging or inducing vomiting?: No Has the patient recently lost weight without trying?: 0 Has the patient been eating poorly because of a decreased appetite?: 0 Malnutrition Screening Tool Score: 0    Physical Exam HENT:     Head: Normocephalic and atraumatic.     Nose: Nose normal.  Eyes:     Pupils: Pupils are equal, round, and reactive to light.  Cardiovascular:     Rate and Rhythm: Normal rate.  Pulmonary:     Effort: Pulmonary effort is normal.  Abdominal:     General: Abdomen is flat.  Musculoskeletal:        General: Normal range of motion.  Skin:    General: Skin is warm.  Neurological:     Mental Status: He is alert and oriented to person, place, and time.  Psychiatric:        Attention and Perception: Attention normal.        Mood and Affect: Mood is anxious and depressed. Affect is angry.        Speech: Speech normal.        Behavior: Behavior is cooperative.        Thought Content: Thought content is not paranoid or delusional. Thought content does not include homicidal or suicidal ideation. Thought content does not include homicidal or suicidal plan.        Cognition and Memory: Cognition normal.        Judgment: Judgment is impulsive.    Review of Systems  Constitutional: Negative.   HENT: Negative.    Eyes: Negative.   Respiratory: Negative.    Cardiovascular: Negative.   Gastrointestinal: Negative.   Genitourinary: Negative.    Musculoskeletal: Negative.   Skin: Negative.   Neurological: Negative.   Endo/Heme/Allergies: Negative.   Psychiatric/Behavioral:  Positive for depression. The patient is nervous/anxious.     Blood pressure (!) 140/94, pulse 79, temperature 98.3 F (36.8 C), temperature source Oral, resp. rate 16, SpO2 99 %. There is no height or weight on file to calculate BMI.  Past Psychiatric History: Inpatient at [facility] March 2023, 2019 at [place] for MDD -2020 at [place] after an overdose in a suicide attempt -2023 at [facility] for depressive symptoms  Is the patient at risk to self? No  Has the patient been a risk to self in the past 6 months? Yes .    Has the patient been a risk to self within the distant past? No   Is the patient a risk to others? No   Has the patient been a risk to others in the past 6 months? No   Has the patient been a risk to others within the distant past? 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, [place], [place] [address]   Sodium 01/15/2023 139  135 - 145 mmol/L Final   Potassium 01/15/2023 4.3  3.5 - 5.1 mmol/L Final   Chloride 01/15/2023 103  98 - 111 mmol/L Final   CO2 01/15/2023 23  22 - 32 mmol/L Final   Glucose, Bld 01/15/2023 69 (L)  70 - 99 mg/dL Final   Glucose reference range applies only to samples taken after fasting for at least 8 hours.   BUN 01/15/2023 11  6 - 20 mg/dL Final   Creatinine, Ser 01/15/2023 1.11  0.61 - 1.24 mg/dL Final   Calcium 01/15/2023 9.7  8.9 - 10.3 mg/dL Final   Total Protein 01/15/2023 6.8  6.5 - 8.1 g/dL Final   Albumin 01/15/2023 4.1  3.5 - 5.0 g/dL Final   AST 01/15/2023 24  15 - 41 U/L Final   ALT 01/15/2023  22  0 - 44 U/L Final   Alkaline Phosphatase 01/15/2023 53  38 - 126 U/L Final   Total Bilirubin 01/15/2023 0.4  0.3 - 1.2 mg/dL Final   GFR, Estimated 01/15/2023 >60  >60 mL/min Final   Comment: (NOTE) Calculated using the CKD-EPI Creatinine Equation (2021)    Anion gap 01/15/2023 13  5 - 15 Final   Performed at [facility] [address], [place], [place] [address]   Cholesterol 01/15/2023 231 (H)  0 - 200 mg/dL Final   Triglycerides 01/15/2023 78  <150 mg/dL Final   HDL 01/15/2023 48  >40 mg/dL Final   Total CHOL/HDL Ratio 01/15/2023 4.8  RATIO Final   VLDL 01/15/2023 16  0 - 40 mg/dL Final   LDL Cholesterol 01/15/2023 167 (H)  0 - 99 mg/dL Final   Comment:        Total Cholesterol/HDL:CHD Risk Coronary Heart Disease Risk Table                     Men   Women  1/2 Average Risk   3.4   3.3  Average Risk       5.0   4.4  2 X Average Risk   9.6   7.1  3 X Average Risk  23.4   11.0        Use the calculated Patient Ratio above and the CHD Risk Table to determine the patient's CHD Risk.        ATP III CLASSIFICATION (LDL):  <100     mg/dL   Optimal  100-129  mg/dL   Near or Above                    Optimal  130-159  mg/dL   Borderline  160-189  mg/dL   High  >190     mg/dL   Very High Performed at [facility] [address], [place], [place] [address]    Alcohol, Ethyl (B) 01/15/2023 <10  <10 mg/dL Final   Comment: (NOTE) Lowest detectable limit for serum alcohol is 10 mg/dL.  For medical purposes only. Performed at [facility], [place] [address], [place], [place] [address]    POC Amphetamine UR  01/15/2023 None Detected  NONE DETECTED (Cut Off Level 1000 ng/mL) Final   POC Secobarbital (BAR) 01/15/2023 None Detected  NONE DETECTED (Cut Off Level 300 ng/mL) Final   POC Buprenorphine (BUP) 01/15/2023 None Detected  NONE DETECTED (Cut Off Level 10 ng/mL) Final   POC Oxazepam (BZO) 01/15/2023 None Detected  NONE DETECTED (Cut Off Level 300 ng/mL) Final    POC Cocaine UR 01/15/2023 None Detected  NONE DETECTED (Cut Off Level 300 ng/mL) Final   POC Methamphetamine UR 01/15/2023 None Detected  NONE DETECTED (Cut Off Level 1000 ng/mL) Final   POC Morphine 01/15/2023 None Detected  NONE DETECTED (Cut Off Level 300 ng/mL) Final   POC Methadone UR 01/15/2023 None Detected  NONE DETECTED (Cut Off Level 300 ng/mL) Final   POC Oxycodone UR 01/15/2023 None Detected  NONE DETECTED (Cut Off Level 100 ng/mL) Final   POC Marijuana UR 01/15/2023 None Detected  NONE DETECTED (Cut Off Level 50 ng/mL) Final   SARSCOV2ONAVIRUS 2 AG 01/15/2023 NEGATIVE  NEGATIVE Final   Comment: (NOTE) SARS-CoV-2 antigen NOT DETECTED.   Negative results are presumptive.  Negative results do not preclude SARS-CoV-2 infection and should not be used as the sole basis for treatment or other patient management decisions, including infection  control decisions, particularly in the presence of clinical signs and  symptoms consistent with COVID-19, or in those who have been in contact with the virus.  Negative results must be combined with clinical observations, patient history, and epidemiological information. The expected result is Negative.  Fact Sheet for Patients: HandmadeRecipes.com.cy  Fact Sheet for Healthcare Providers: FuneralLife.at  This test is not yet approved or cleared by the [place] FDA and  has been authorized for detection and/or diagnosis of SARS-CoV-2 by FDA under an Emergency Use Authorization (EUA).  This EUA will remain in effect (meaning this test can be used) for the duration of  the COV                          ID-19 declaration under Section 564(b)(1) of the Act, 21 U.S.C. section 360bbb-3(b)(1), unless the authorization is terminated or revoked sooner.    Admission on 01/04/2023, Discharged on 01/04/2023  Component Date Value Ref Range Status   Sodium 01/04/2023 138  135 - 145 mmol/L Final    Potassium 01/04/2023 3.8  3.5 - 5.1 mmol/L Final   Chloride 01/04/2023 108  98 - 111 mmol/L Final   CO2 01/04/2023 21 (L)  22 - 32 mmol/L Final   Glucose, Bld 01/04/2023 101 (H)  70 - 99 mg/dL Final   Glucose reference range applies only to samples taken after fasting for at least 8 hours.   BUN 01/04/2023 19  6 - 20 mg/dL Final   Creatinine, Ser 01/04/2023 0.84  0.61 - 1.24 mg/dL Final   Calcium 01/04/2023 9.0  8.9 - 10.3 mg/dL Final   Total Protein 01/04/2023 7.4  6.5 - 8.1 g/dL Final   Albumin 01/04/2023 4.5  3.5 - 5.0 g/dL Final   AST 01/04/2023 26  15 - 41 U/L Final   ALT 01/04/2023 41  0 - 44 U/L Final   Alkaline Phosphatase 01/04/2023 56  38 - 126 U/L Final   Total Bilirubin 01/04/2023 0.4  0.3 - 1.2 mg/dL Final   GFR, Estimated 01/04/2023 >60  >60 mL/min Final   Comment: (NOTE) Calculated using the CKD-EPI Creatinine Equation (2021)    Anion gap 01/04/2023 9  5 - 15 Final  Performed at [facility], [place] [address], [place], [place] [address]   WBC 01/04/2023 5.8  4.0 - 10.5 K/uL Final   RBC 01/04/2023 4.74  4.22 - 5.81 MIL/uL Final   Hemoglobin 01/04/2023 13.6  13.0 - 17.0 g/dL Final   HCT 01/04/2023 41.0  39.0 - 52.0 % Final   MCV 01/04/2023 86.5  80.0 - 100.0 fL Final   MCH 01/04/2023 28.7  26.0 - 34.0 pg Final   MCHC 01/04/2023 33.2  30.0 - 36.0 g/dL Final   RDW 01/04/2023 13.6  11.5 - 15.5 % Final   Platelets 01/04/2023 333  150 - 400 K/uL Final   nRBC 01/04/2023 0.0  0.0 - 0.2 % Final   Neutrophils Relative % 01/04/2023 58  % Final   Neutro Abs 01/04/2023 3.3  1.7 - 7.7 K/uL Final   Lymphocytes Relative 01/04/2023 31  % Final   Lymphs Abs 01/04/2023 1.8  0.7 - 4.0 K/uL Final   Monocytes Relative 01/04/2023 8  % Final   Monocytes Absolute 01/04/2023 0.5  0.1 - 1.0 K/uL Final   Eosinophils Relative 01/04/2023 3  % Final   Eosinophils Absolute 01/04/2023 0.2  0.0 - 0.5 K/uL Final   Basophils Relative 01/04/2023 0  % Final   Basophils Absolute  01/04/2023 0.0  0.0 - 0.1 K/uL Final   Immature Granulocytes 01/04/2023 0  % Final   Abs Immature Granulocytes 01/04/2023 0.02  0.00 - 0.07 K/uL Final   Performed at [facility], [place] [address], [place], [place] [address]   SARS Coronavirus 2 by RT PCR 01/04/2023 NEGATIVE  NEGATIVE Final   Comment: (NOTE) SARS-CoV-2 target nucleic acids are NOT DETECTED.  The SARS-CoV-2 RNA is generally detectable in upper respiratory specimens during the acute phase of infection. The lowest concentration of SARS-CoV-2 viral copies this assay can detect is 138 copies/mL. A negative result does not preclude SARS-Cov-2 infection and should not be used as the sole basis for treatment or other patient management decisions. A negative result may occur with  improper specimen collection/handling, submission of specimen other than nasopharyngeal swab, presence of viral mutation(s) within the areas targeted by this assay, and inadequate number of viral copies(<138 copies/mL). A negative result must be combined with clinical observations, patient history, and epidemiological information. The expected result is Negative.  Fact Sheet for Patients:  EntrepreneurPulse.com.au  Fact Sheet for Healthcare Providers:  IncredibleEmployment.be  This test is no                          t yet approved or cleared by the [place] FDA and  has been authorized for detection and/or diagnosis of SARS-CoV-2 by FDA under an Emergency Use Authorization (EUA). This EUA will remain  in effect (meaning this test can be used) for the duration of the COVID-19 declaration under Section 564(b)(1) of the Act, 21 U.S.C.section 360bbb-3(b)(1), unless the authorization is terminated  or revoked sooner.       Influenza A by PCR 01/04/2023 NEGATIVE  NEGATIVE Final   Influenza B by PCR 01/04/2023 NEGATIVE  NEGATIVE Final   Comment: (NOTE) The Xpert Xpress SARS-CoV-2/FLU/RSV  plus assay is intended as an aid in the diagnosis of influenza from Nasopharyngeal swab specimens and should not be used as a sole basis for treatment. Nasal washings and aspirates are unacceptable for Xpert Xpress SARS-CoV-2/FLU/RSV testing.  Fact Sheet for Patients: EntrepreneurPulse.com.au  Fact Sheet for Healthcare Providers: IncredibleEmployment.be  This  test is not yet approved or cleared by the [place] and has been authorized for detection and/or diagnosis of SARS-CoV-2 by FDA under an Emergency Use Authorization (EUA). This EUA will remain in effect (meaning this test can be used) for the duration of the COVID-19 declaration under Section 564(b)(1) of the Act, 21 U.S.C. section 360bbb-3(b)(1), unless the authorization is terminated or revoked.     Resp Syncytial Virus by PCR 01/04/2023 NEGATIVE  NEGATIVE Final   Comment: (NOTE) Fact Sheet for Patients: EntrepreneurPulse.com.au  Fact Sheet for Healthcare Providers: IncredibleEmployment.be  This test is not yet approved or cleared by the [place] FDA and has been authorized for detection and/or diagnosis of SARS-CoV-2 by FDA under an Emergency Use Authorization (EUA). This EUA will remain in effect (meaning this test can be used) for the duration of the COVID-19 declaration under Section 564(b)(1) of the Act, 21 U.S.C. section 360bbb-3(b)(1), unless the authorization is terminated or revoked.  Performed at [facility], [place] [address] [place], [place] [address]    Alcohol, Ethyl (B) 01/04/2023 <10  <10 mg/dL Final   Comment: (NOTE) Lowest detectable limit for serum alcohol is 10 mg/dL.  For medical purposes only. Performed at [facility], [place] [address] [place], [place] [address]    Opiates 01/04/2023 NONE DETECTED  NONE DETECTED Final   Cocaine 01/04/2023 NONE DETECTED  NONE DETECTED  Final   Benzodiazepines 01/04/2023 NONE DETECTED  NONE DETECTED Final   Amphetamines 01/04/2023 NONE DETECTED  NONE DETECTED Final   Tetrahydrocannabinol 01/04/2023 NONE DETECTED  NONE DETECTED Final   Barbiturates 01/04/2023 NONE DETECTED  NONE DETECTED Final   Comment: (NOTE) DRUG SCREEN FOR MEDICAL PURPOSES ONLY.  IF CONFIRMATION IS NEEDED FOR ANY PURPOSE, NOTIFY LAB WITHIN 5 DAYS.  LOWEST DETECTABLE LIMITS FOR URINE DRUG SCREEN Drug Class                     Cutoff (ng/mL) Amphetamine and metabolites    1000 Barbiturate and metabolites    200 Benzodiazepine                 200 Opiates and metabolites        300 Cocaine and metabolites        300 THC                            50 Performed at [facility], [place] [address] [place],  [address]   Admission on 12/18/2022, Discharged on 12/23/2022  Component Date Value Ref Range Status   Color, Urine 12/19/2022 AMBER (A)  YELLOW Final   BIOCHEMICALS MAY BE AFFECTED BY COLOR   APPearance 12/19/2022 TURBID (A)  CLEAR Final   Specific Gravity, Urine 12/19/2022 1.025  1.005 - 1.030 Final   pH 12/19/2022 5.0  5.0 - 8.0 Final   Glucose, UA 12/19/2022 NEGATIVE  NEGATIVE mg/dL Final   Hgb urine dipstick 12/19/2022 MODERATE (A)  NEGATIVE Final   Bilirubin Urine 12/19/2022 NEGATIVE  NEGATIVE Final   Ketones, ur 12/19/2022 NEGATIVE  NEGATIVE mg/dL Final   Protein, ur 12/19/2022 NEGATIVE  NEGATIVE mg/dL Final   Nitrite 12/19/2022 NEGATIVE  NEGATIVE Final   Leukocytes,Ua 12/19/2022 NEGATIVE  NEGATIVE Final   RBC / HPF 12/19/2022 6-10  0 - 5 RBC/hpf Final   WBC, UA 12/19/2022 0-5  0 - 5 WBC/hpf Final   Bacteria, UA 12/19/2022 RARE (A)  NONE SEEN Final  Squamous Epithelial / HPF 12/19/2022 0-5  0 - 5 /HPF Final   Mucus 12/19/2022 PRESENT   Final   Ca Oxalate Crys, UA 12/19/2022 PRESENT   Final   Performed at [facility], [place] [address] [place], [place] [address]   TSH 12/20/2022 1.647  0.350  - 4.500 uIU/mL Final   Comment: Performed by a 3rd Generation assay with a functional sensitivity of <=0.01 uIU/mL. Performed at [facility], [place] [address], [place], [place] [address]    Hgb A1c MFr Bld 12/20/2022 5.7 (H)  4.8 - 5.6 % Final   Comment: (NOTE)         Prediabetes: 5.7 - 6.4         Diabetes: >6.4         Glycemic control for adults with diabetes: <7.0    Mean Plasma Glucose 12/20/2022 117  mg/dL Final   Comment: (NOTE) Performed At: [facility] [place], [place] [id number] Rush Farmer MD [id number]    Cholesterol 12/20/2022 201 (H)  0 - 200 mg/dL Final   Triglycerides 12/20/2022 137  <150 mg/dL Final   HDL 12/20/2022 42  >40 mg/dL Final   Total CHOL/HDL Ratio 12/20/2022 4.8  RATIO Final   VLDL 12/20/2022 27  0 - 40 mg/dL Final   LDL Cholesterol 12/20/2022 132 (H)  0 - 99 mg/dL Final   Comment:        Total Cholesterol/HDL:CHD Risk Coronary Heart Disease Risk Table                     Men   Women  1/2 Average Risk   3.4   3.3  Average Risk       5.0   4.4  2 X Average Risk   9.6   7.1  3 X Average Risk  23.4   11.0        Use the calculated Patient Ratio above and the CHD Risk Table to determine the patient's CHD Risk.        ATP III CLASSIFICATION (LDL):  <100     mg/dL   Optimal  100-129  mg/dL   Near or Above                    Optimal  130-159  mg/dL   Borderline  160-189  mg/dL   High  >190     mg/dL   Very High Performed at [place] [address], [place], [place] [address]    Vit D, 25-Hydroxy 12/20/2022 13.29 (L)  30 - 100 ng/mL Final   Comment: (NOTE) Vitamin D deficiency has been defined by the [place] practice guideline as a level of serum 25-OH  vitamin D less than 20 ng/mL (1,2). The Endocrine Society went on to  further define vitamin D insufficiency as a level between 21 and 29  ng/mL (2).  1. IOM (Institute of Medicine).  2010. Dietary reference intakes for  calcium and D. [place]: The [employer]. 2. Holick MF, Binkley [place], Bischoff-Ferrari HA, et al. Evaluation,  treatment, and prevention of vitamin D deficiency: an Endocrine  Society clinical practice guideline, JCEM. 2011 Jul; 96(7): 1911-30.  Performed at [facility], [place] [address], [place], [place] [address]   Admission on 12/17/2022, Discharged on 12/18/2022  Component Date Value Ref Range Status   Sodium 12/17/2022 137  135 - 145  mmol/L Final   Potassium 12/17/2022 4.6  3.5 - 5.1 mmol/L Final   Chloride 12/17/2022 105  98 - 111 mmol/L Final   CO2 12/17/2022 23  22 - 32 mmol/L Final   Glucose, Bld 12/17/2022 107 (H)  70 - 99 mg/dL Final   Glucose reference range applies only to samples taken after fasting for at least 8 hours.   BUN 12/17/2022 9  6 - 20 mg/dL Final   Creatinine, Ser 12/17/2022 0.98  0.61 - 1.24 mg/dL Final   Calcium 12/17/2022 9.4  8.9 - 10.3 mg/dL Final   Total Protein 12/17/2022 7.6  6.5 - 8.1 g/dL Final   Albumin 12/17/2022 4.3  3.5 - 5.0 g/dL Final   AST 12/17/2022 25  15 - 41 U/L Final   ALT 12/17/2022 37  0 - 44 U/L Final   Alkaline Phosphatase 12/17/2022 48  38 - 126 U/L Final   Total Bilirubin 12/17/2022 0.6  0.3 - 1.2 mg/dL Final   GFR, Estimated 12/17/2022 >60  >60 mL/min Final   Comment: (NOTE) Calculated using the CKD-EPI Creatinine Equation (2021)    Anion gap 12/17/2022 9  5 - 15 Final   Performed at [facility], [place] [address], [place], [place] [address]   Alcohol, Ethyl (B) 12/17/2022 <10  <10 mg/dL Final   Comment: (NOTE) Lowest detectable limit for serum alcohol is 10 mg/dL.  For medical purposes only. Performed at [facility], [place] [address], [place], [place] [id number]    Salicylate Lvl [id number] <7.0 (L)  7.0 - 30.0 mg/dL Final   Performed at [place] [address], [place], [place] [address]   Acetaminophen  (Tylenol), Serum 12/17/2022 <10 (L)  10 - 30 ug/mL Final   Comment: (NOTE) Therapeutic concentrations vary significantly. A range of 10-30 ug/mL  may be an effective concentration for many patients. However, some  are best treated at concentrations outside of this range. Acetaminophen concentrations >150 ug/mL at 4 hours after ingestion  and >50 ug/mL at 12 hours after ingestion are often associated with  toxic reactions.  Performed at [facility], [place] [address] [place], [place] [address]    Opiates 12/17/2022 NONE DETECTED  NONE DETECTED Final   Cocaine 12/17/2022 NONE DETECTED  NONE DETECTED Final   Benzodiazepines 12/17/2022 NONE DETECTED  NONE DETECTED Final   Amphetamines 12/17/2022 NONE DETECTED  NONE DETECTED Final   Tetrahydrocannabinol 12/17/2022 NONE DETECTED  NONE DETECTED Final   Barbiturates 12/17/2022 NONE DETECTED  NONE DETECTED Final   Comment: (NOTE) DRUG SCREEN FOR MEDICAL PURPOSES ONLY.  IF CONFIRMATION IS NEEDED FOR ANY PURPOSE, NOTIFY LAB WITHIN 5 DAYS.  LOWEST DETECTABLE LIMITS FOR URINE DRUG SCREEN Drug Class                     Cutoff (ng/mL) Amphetamine and metabolites    1000 Barbiturate and metabolites    200 Benzodiazepine                 200 Opiates and metabolites        300 Cocaine and metabolites        300 THC                            50 Performed at [facility], [place] [address] [place], [place] [address]    WBC 12/17/2022 7.4  4.0 - 10.5 K/uL Final  RBC 12/17/2022 4.92  4.22 - 5.81 MIL/uL Final   Hemoglobin 12/17/2022 13.8  13.0 - 17.0 g/dL Final   HCT 12/17/2022 42.7  39.0 - 52.0 % Final   MCV 12/17/2022 86.8  80.0 - 100.0 fL Final   MCH 12/17/2022 28.0  26.0 - 34.0 pg Final   MCHC 12/17/2022 32.3  30.0 - 36.0 g/dL Final   RDW 12/17/2022 13.2  11.5 - 15.5 % Final   Platelets 12/17/2022 316  150 - 400 K/uL Final   nRBC 12/17/2022 0.0  0.0 - 0.2 % Final   Neutrophils Relative % 12/17/2022 70  %  Final   Neutro Abs 12/17/2022 5.1  1.7 - 7.7 K/uL Final   Lymphocytes Relative 12/17/2022 21  % Final   Lymphs Abs 12/17/2022 1.6  0.7 - 4.0 K/uL Final   Monocytes Relative 12/17/2022 8  % Final   Monocytes Absolute 12/17/2022 0.6  0.1 - 1.0 K/uL Final   Eosinophils Relative 12/17/2022 1  % Final   Eosinophils Absolute 12/17/2022 0.1  0.0 - 0.5 K/uL Final   Basophils Relative 12/17/2022 0  % Final   Basophils Absolute 12/17/2022 0.0  0.0 - 0.1 K/uL Final   Immature Granulocytes 12/17/2022 0  % Final   Abs Immature Granulocytes 12/17/2022 0.02  0.00 - 0.07 K/uL Final   Performed at [facility], [place] [address], [place], [place] [address]   SARS Coronavirus 2 by RT PCR 12/17/2022 NEGATIVE  NEGATIVE Final   Comment: (NOTE) SARS-CoV-2 target nucleic acids are NOT DETECTED.  The SARS-CoV-2 RNA is generally detectable in upper respiratory specimens during the acute phase of infection. The lowest concentration of SARS-CoV-2 viral copies this assay can detect is 138 copies/mL. A negative result does not preclude SARS-Cov-2 infection and should not be used as the sole basis for treatment or other patient management decisions. A negative result may occur with  improper specimen collection/handling, submission of specimen other than nasopharyngeal swab, presence of viral mutation(s) within the areas targeted by this assay, and inadequate number of viral copies(<138 copies/mL). A negative result must be combined with clinical observations, patient history, and epidemiological information. The expected result is Negative.  Fact Sheet for Patients:  EntrepreneurPulse.com.au  Fact Sheet for Healthcare Providers:  IncredibleEmployment.be  This test is no                          t yet approved or cleared by the [place] FDA and  has been authorized for detection and/or diagnosis of SARS-CoV-2 by FDA under an Emergency Use Authorization  (EUA). This EUA will remain  in effect (meaning this test can be used) for the duration of the COVID-19 declaration under Section 564(b)(1) of the Act, 21 U.S.C.section 360bbb-3(b)(1), unless the authorization is terminated  or revoked sooner.       Influenza A by PCR 12/17/2022 NEGATIVE  NEGATIVE Final   Influenza B by PCR 12/17/2022 NEGATIVE  NEGATIVE Final   Comment: (NOTE) The Xpert Xpress SARS-CoV-2/FLU/RSV plus assay is intended as an aid in the diagnosis of influenza from Nasopharyngeal swab specimens and should not be used as a sole basis for treatment. Nasal washings and aspirates are unacceptable for Xpert Xpress SARS-CoV-2/FLU/RSV testing.  Fact Sheet for Patients: EntrepreneurPulse.com.au  Fact Sheet for Healthcare Providers: IncredibleEmployment.be  This test is not yet approved or cleared by the [place] FDA and has been authorized for detection and/or diagnosis of SARS-CoV-2 by FDA under an  Emergency Use Authorization (EUA). This EUA will remain in effect (meaning this test can be used) for the duration of the COVID-19 declaration under Section 564(b)(1) of the Act, 21 U.S.C. section 360bbb-3(b)(1), unless the authorization is terminated or revoked.     Resp Syncytial Virus by PCR 12/17/2022 NEGATIVE  NEGATIVE Final   Comment: (NOTE) Fact Sheet for Patients: EntrepreneurPulse.com.au  Fact Sheet for Healthcare Providers: IncredibleEmployment.be  This test is not yet approved or cleared by the [place] FDA and has been authorized for detection and/or diagnosis of SARS-CoV-2 by FDA under an Emergency Use Authorization (EUA). This EUA will remain in effect (meaning this test can be used) for the duration of the COVID-19 declaration under Section 564(b)(1) of the Act, 21 U.S.C. section 360bbb-3(b)(1), unless the authorization is terminated or revoked.  Performed at [facility], [place] [address], [place], [place] [address]     Allergies: Mushroom extract complex and Trazodone and nefazodone  Medications:  Facility Ordered Medications  Medication   acetaminophen (TYLENOL) tablet 650 mg   alum & mag hydroxide-simeth (MAALOX/MYLANTA) 200-200-20 MG/5ML suspension 30 mL   magnesium hydroxide (MILK OF MAGNESIA) suspension 30 mL   escitalopram (LEXAPRO) tablet 10 mg   mirtazapine (REMERON) tablet 7.5 mg   PTA Medications  Medication Sig   amLODipine (NORVASC) 5 MG tablet Take 1 tablet (5 mg total) by mouth daily.   escitalopram (LEXAPRO) 10 MG tablet Take 1 tablet (10 mg total) by mouth daily.   mirtazapine (REMERON) 7.5 MG tablet Take 1 tablet (7.5 mg total) by mouth at bedtime.    Medical Decision Making  Jacob Contreras is a 32 y/o male with a prior mental health history of MDD, PTSD, suicidal attempt presenting to [facility] via [employer] after an argument with his girlfriend.  Patient states that he has girlfriend were arguing about the fact that his girlfriend informed her friends and family about the death of his grandmother which he was not ready to disclose his grandmother to other people or talk about her death.  Patient states that he became upset and angry because people were trying to tell him how to feel and how to grieve.  Patient reports that [employer] was called twice to their apartment tonight and after the second call patient ws brought to [facility] to be evaluated.    Recommendations  Based on my evaluation the patient does not appear to have an emergency medical condition. Patient will be admitted to [facility] continuous assessment for crisis management, safety and stabilization.  Lucia Bitter, NP 01/16/23  6:47 AM

## 2023-01-15 NOTE — Progress Notes (Signed)
01/15/23 2018  Patient Reported Information  How Did You Hear About [place]? Legal System  What Is the Reason for Your Visit/Call Today? Pt reports, his grandmother passed away about a month ago but feels he really doesn't have supports. Pt reports, he's not grieved or processed his grandmother passing. Pt reports, his girlfriend is not much of a support, she told her family and friends about the passing of his grandmother when he wanted to be the one to tell her family. Per pt, his girlfriend eavesdropped on two of his conversations with two of his aunts. Pt reports, the police were called twice, once by his girlfriend and once by him. Pt denies, SI, HI, AVH, self-injurious behaviors and access to weapons.  How Long Has This Been Causing You Problems? 1 wk - 1 month  What Do You Feel Would Help You the Most Today? Treatment for Depression or other mood problem;Stress Management;Social Support  Have You Recently Had Any Thoughts About Hurting Yourself? No  Are You Planning to Commit Suicide/Harm Yourself At This time? No  Have you Recently Had Thoughts About [place]? No  Are You Planning To Harm Someone At This Time? No  Explanation: Pt denies, HI.  Have You Used Any Alcohol or Drugs in the Past 24 Hours? Yes  What Did You Use and How Much? Pt reports, he drank a little bit of wine.  Do You Currently Have a Therapist/Psychiatrist? Yes  Name of Therapist/Psychiatrist Pt is linked to Dr. Heywood Iles for therapy and is prescribed medications.  Have You Been Recently Discharged From Any Office Practice or Programs? Yes  Explanation of Discharge From Practice/Program Pt was discharged from [facility] after a suicide attempt on 12/23/2022, after a five day stay.  [facility] Screening Triage Referral Assessment  Type of Contact Face-to-Face  Location of Assessment [facility] Assessment Services  Provider location [facility] Assessment Services  Collateral Involvement Pt declined for clincian to contact  his girlfriend.  Does Patient Have a Stage manager Guardian? No  Legal Guardian Contact Information Pt is his own guardian.  Copy of Legal Guardianship Form in Chart No - copy requested  Legal Guardian Notified of Arrival   (Pt is his own guardian.)  Legal Guardian Notified of Pending Discharge   (Pt is his own guardian.)  If Minor and Not Living with Parent(s), Who has Custody? Pt is an adult, pt is his own guardian.  Is CPS involved or ever been involved? Never  Is APS involved or ever been involved? Never  Patient Determined To Be At Risk for Harm To Self or Others Based on Review of Patient Reported Information or Presenting Complaint? No  Method No Plan  Availability of Means No access or NA  Intent Vague intent or NA  Notification Required No need or identified person  Additional Information for Danger to Others Potential  (Pt denies, HI.)  Additional Comments for Danger to Others Potential Pt denies, HI.  Are There Guns or Other Weapons in [place]? No  Types of Guns/Weapons Pt denies, access to weapons.  Are These Weapons Safely Secured?  (Pt denies, access to weapons.)  Who Could Verify You Are Able To Have These Secured: Pt denies, access to weapons.  Do You Have any Outstanding Charges, Pending Court Dates, Parole/Probation? Pt denies, legal involvement.  Contacted To Inform of Risk of Harm To Self or Others: Other: Comment (None.)  Does Patient Present under Involuntary Commitment? No  [place] of Residence  [place]  Patient Currently Receiving the Following Services: Medication Management;Individual Therapy  Determination of Need Urgent (48 hours)  Options For Referral Medication Management;Outpatient Therapy;BH Urgent Care;Inpatient Hospitalization    Determination of need: Urgent.    Vertell Novak, [place], [facility], [facility] Triage Specialist [phone]
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
## 2023-01-16 ENCOUNTER — Ambulatory Visit (HOSPITAL_COMMUNITY)
Admission: EM | Admit: 2023-01-16 | Discharge: 2023-01-16 | Disposition: A | Discharge status: Home / Self Care | Payer: No Typology Code available for payment source

## 2023-01-16 DIAGNOSIS — Z638 Other specified problems related to primary support group: Secondary | ICD-10-CM | POA: Diagnosis not present

## 2023-01-16 DIAGNOSIS — Z5900 Homelessness unspecified: Secondary | ICD-10-CM | POA: Diagnosis not present

## 2023-01-16 NOTE — ED Notes (Signed)
Patient discharged with written and verbal instructions. Resources given.

## 2023-01-16 NOTE — ED Notes (Signed)
Pt refused breakfast 

## 2023-01-16 NOTE — ED Provider Notes (Signed)
Behavioral Health Urgent Care Medical Screening Exam  Patient Name: Jacob Contreras MRN: [id number] Date of Evaluation: 01/16/23 Chief Complaint:   Diagnosis:  Final diagnoses:  Homelessness  Family discord    History of Present illness: Jacob Contreras is a 32 y.o. male.  Presents to [facility] urgent care seeking housing. He reports he was recently discharged earlier this morning and attempted to go back home to be with is significant other however reports constant arguments and disagreements and does not feel safe to stay there another night.  He reports " I was told, I can come back here until the homeless shelter opens on tomorrow."  He is denying suicidal or homicidal ideations.  Denies auditory or visual hallucinations.  Reports he is followed by [facility]  ([place] for medication management. Stated that his therapist Jacob Contreras with [facility] is out on leave.   He denied illicit drug use or substance abuse.  Reports he was initially admitted due to the disagreement between he and his girlfriend.  States he is struggling with grief and loss related to the passing of his grandmother.  States he has a strong relationship between he and his mother.  Patient reports his mother resides in [place] and he has not spoken to her in quite some time.  During evaluation Jacob Contreras is sitting in no acute distress. he is alert/oriented x 4; calm/cooperative; and mood congruent with affect. he is speaking in a clear tone at moderate volume, and normal pace; with good eye contact.  his thought process is coherent and relevant; There is no indication that he is currently responding to internal/external stimuli or experiencing delusional thought content; and he has denied suicidal/self-harm/homicidal ideation, psychosis, and paranoia. Patient has remained calm throughout assessment and has answered questions appropriately.     Jacob Contreras is educated and verbalizes understanding of mental health resources  and other crisis services in the community. he is instructed to call 911 and present to the nearest emergency room should he experience any suicidal/homicidal ideation, auditory/visual/hallucinations, or detrimental worsening of his mental health condition.  he was a also advised by Probation officer that he could call the toll-free phone on insurance card to assist with identifying in network counselors and agencies or number on back of Medicaid card t speak with care coordinator   [place] ED from 01/16/2023 in [facility] ED from 01/15/2023 in [facility] ED from 01/04/2023 in [facility] Emergency Department at [place] No Risk No Risk No Risk       Psychiatric Specialty Exam  Presentation  General Appearance:Casual  Eye Contact:Good  Speech:Clear and Coherent  Speech Volume:Normal  Handedness:Right   Mood and Affect  Mood: Anxious; Angry; Depressed  Affect: Congruent   Thought Process  Thought Processes: Coherent  Descriptions of Associations:Intact  Orientation:Full (Time, Place and Person)  Thought Content:WDL  Diagnosis of Schizophrenia or Schizoaffective disorder in past: No   Hallucinations:None  Ideas of Reference:None  Suicidal Thoughts:No  Homicidal Thoughts:No   Sensorium  Memory: Immediate Good; Recent Good; Remote Good  Judgment: Good  Insight: Good   Executive Functions  Concentration: Good  Attention Span: Good  Recall: Good  Fund of Knowledge: Good  Language: Good   Psychomotor Activity  Psychomotor Activity: Normal   Assets  Assets: Communication Skills; Physical Health; Desire for Improvement   Sleep  Sleep: Good  Number of hours: No data recorded  Physical Exam: Physical Exam  Vitals and nursing note reviewed.  Constitutional:      Appearance: Normal appearance.  Cardiovascular:     Rate and Rhythm: Normal rate and  regular rhythm.  Neurological:     Mental Status: He is alert and oriented to person, place, and time.  Psychiatric:        Mood and Affect: Mood normal.        Behavior: Behavior normal.        Thought Content: Thought content normal.    Review of Systems  Psychiatric/Behavioral:  Negative for depression, hallucinations, substance abuse and suicidal ideas. The patient is nervous/anxious.   All other systems reviewed and are negative.  Blood pressure (!) 142/98, pulse 89, temperature 99.4 F (37.4 C), temperature source Oral, resp. rate 18, SpO2 96 %. There is no height or weight on file to calculate BMI.  Musculoskeletal: Strength & Muscle Tone: within normal limits Gait & Station: normal Patient leans: N/A   [place] MSE Discharge Disposition for Follow up and Recommendations: Based on my evaluation the patient does not appear to have an emergency medical condition and can be discharged with resources and follow up care in outpatient services for Individual Therapy   [place], NP 01/16/2023, 2:15 PM

## 2023-01-16 NOTE — ED Notes (Signed)
Bus pass given 

## 2023-01-16 NOTE — Discharge Instructions (Signed)

## 2023-01-16 NOTE — ED Triage Notes (Signed)
Pt presents to [facility] voluntarily due to ongoing grief and homelessness. Pt states he cannot return to his home with his girlfriend due to a disagreement. Pt states he is looking for shelter at the moment. Pt denies SI/HI and AVH.

## 2023-01-16 NOTE — ED Notes (Signed)
Pt observed/assessed in room sleeping. RR even and unlabored, appearing in no noted distress. Environmental check complete, will continue to monitor for safety

## 2023-01-16 NOTE — ED Notes (Signed)
Patient A&Ox4. Patient denies SI/HI and AVH. Patient denies any physical complaints when asked. No acute distress noted. Support and encouragement provided. Routine safety checks conducted according to facility protocol. Encouraged patient to notify staff if thoughts of harm toward self or others arise. Patient verbalize understanding and agreement. Will continue to monitor for safety.    

## 2023-01-16 NOTE — ED Notes (Signed)
Patient is a 31y.o. male who presented voluntary and unaccompanied to [facility]. Pt reports, about a month ago his grandmother passed away, he's not grieved or processed her passing; no one has contacted him. Per pt, he had a therapy session today; he talked about his job, living with his significant other, lack of support with his grandmother death; his therapist was unable to provide solutions. Pt reports, he came home after his session and noticed his girlfriend preparing for her event.. Pt reports, he learned his girlfriend told her family and friends (one in [place]) about the passing of his grandmother when he wants to tell them. Pt reports, he just angry he wants to hit something. Pt reports, "If I see God it would be dope." Pt denies, SI, HI, AVH, self-injurious behaviors and access to weapons.   Patient A&Ox4. Patient denies SI/Hi and AVH. Denies any physical complaints when asked. No acute distress noted. Support and encouragement provided. Routine safety checks conducted according to facility protocol. Encouraged patient to notify staff if thoughts of harm toward self or others arise. Patient verbalize understanding and agreement. Will continue to monitor for safety.
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
## 2023-01-16 NOTE — ED Notes (Signed)
Patient A&O x 4, ambulatory. Patient discharged in no acute distress. Patient denied SI/HI, A/VH upon discharge. Patient verbalized understanding of all discharge instructions explained by staff, to include follow up appointments, RX's and safety plan. Patient reported mood 10/10.  Pt belongings returned to patient from locker # 29 intact. Patient escorted to lobby via staff for transport to destination. Safety maintained.   

## 2023-08-02 IMAGING — DX DG CHEST 2V
2 series · 2 of 2 positions shown · non-contrast
Comparison: None.

CLINICAL DATA: Provided history: Chest pain.

EXAM:
CHEST - 2 VIEW

[chest pa]
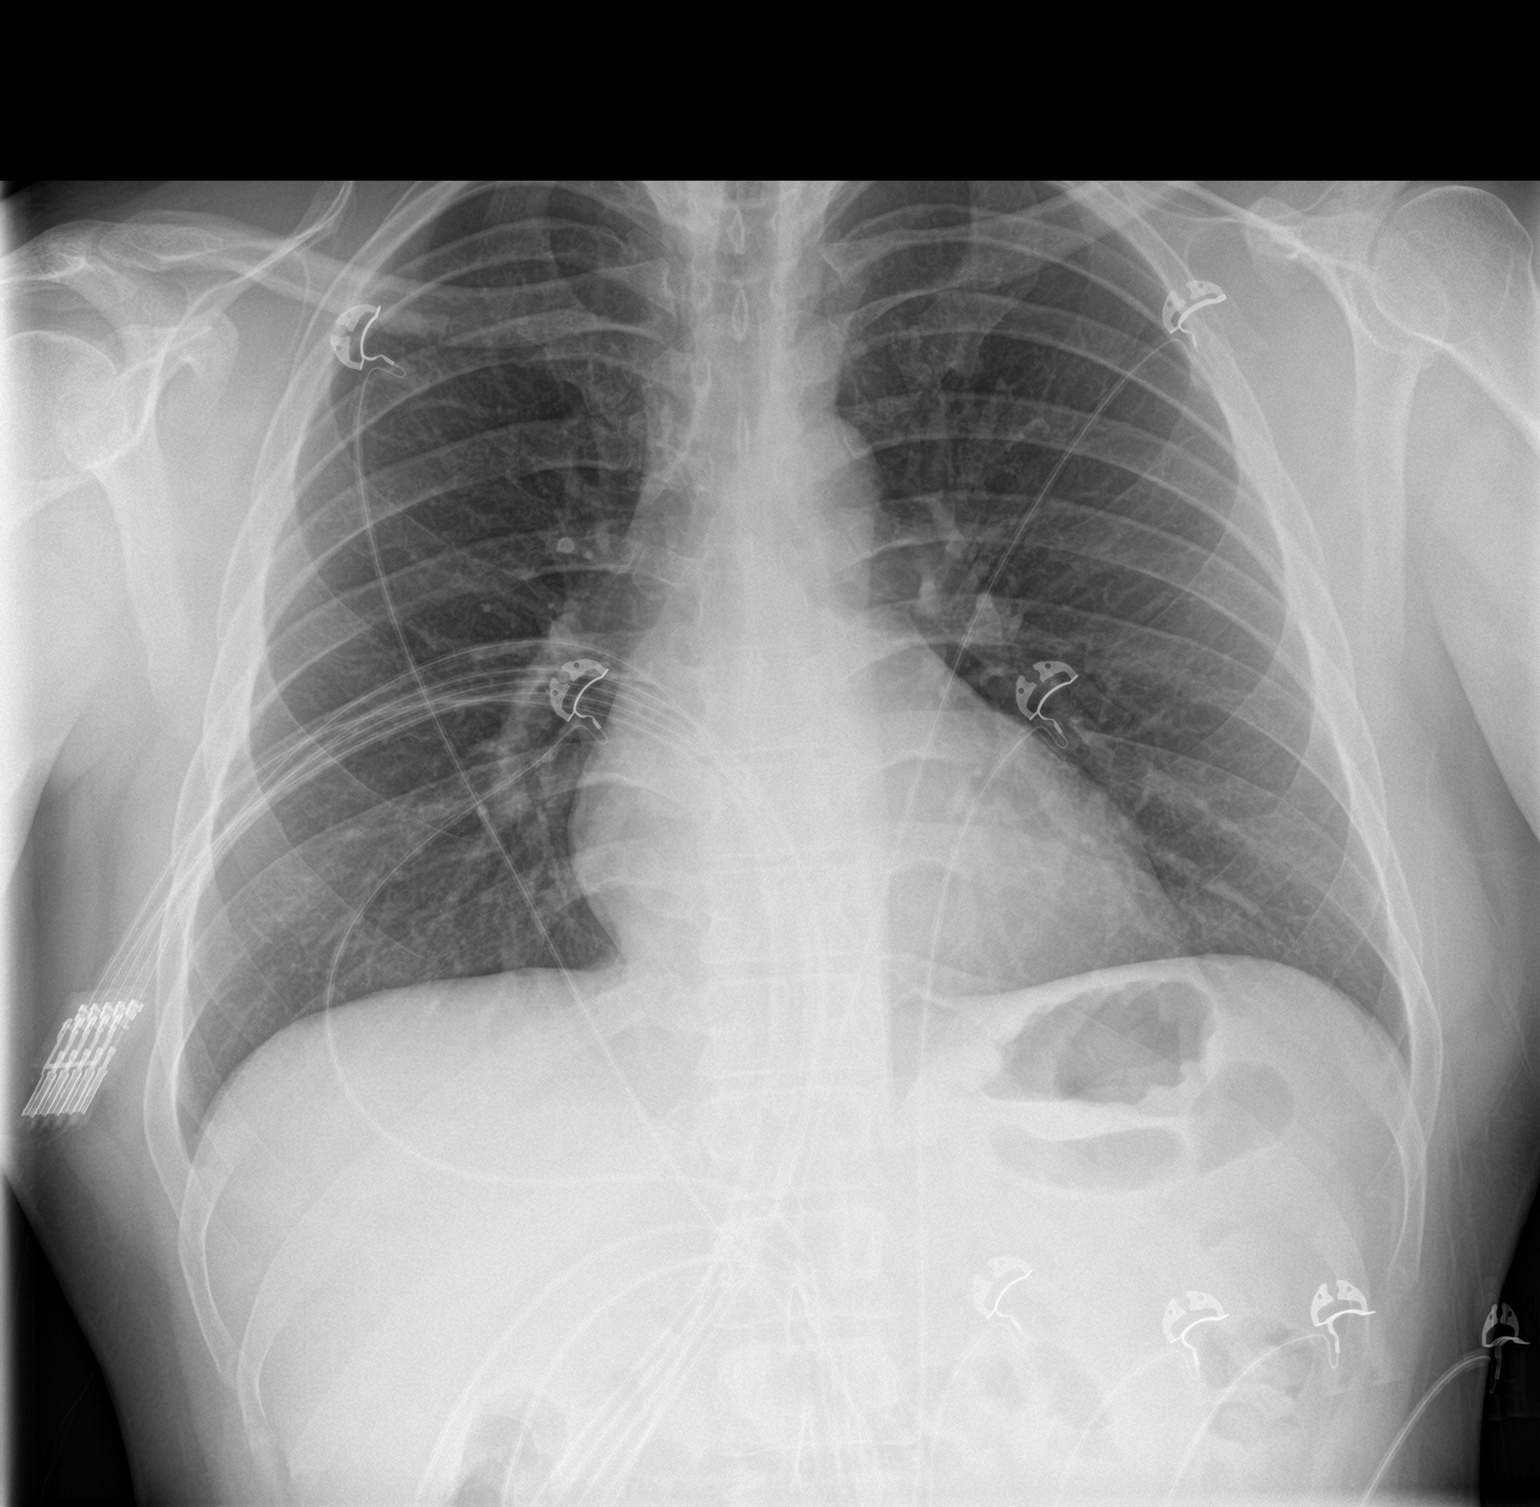

[chest lat]
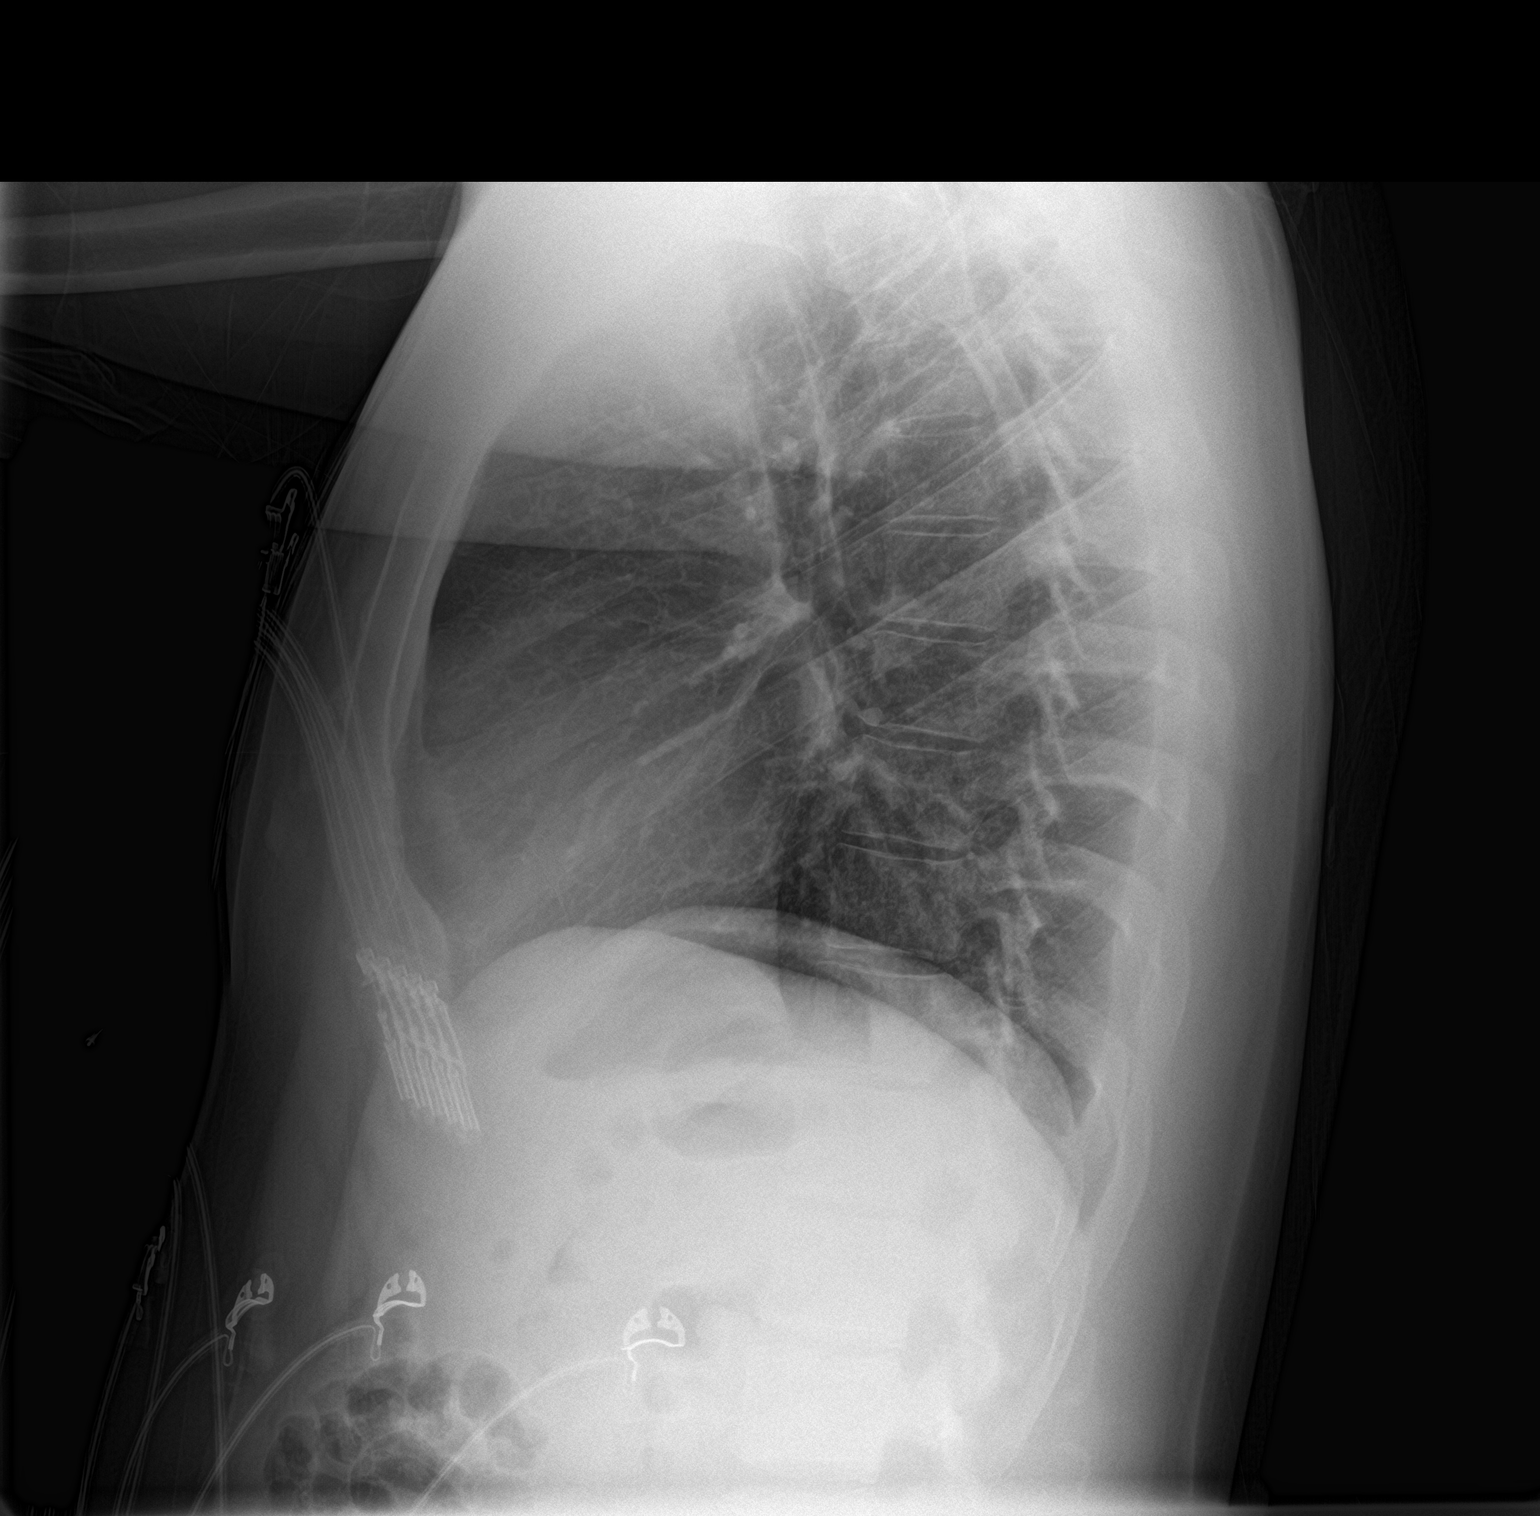

[2 of 2 positions shown; findings below may reference images not displayed]

FINDINGS: Heart size within normal limits. No appreciable airspace
consolidation. No evidence of pleural effusion or pneumothorax. No
acute bony abnormality identified.
IMPRESSION: No evidence of active cardiopulmonary disease.

## 2023-09-30 ENCOUNTER — Emergency Department (HOSPITAL_COMMUNITY): Payer: No Typology Code available for payment source

## 2023-09-30 ENCOUNTER — Encounter (HOSPITAL_COMMUNITY): Payer: Self-pay | Admitting: *Deleted

## 2023-09-30 ENCOUNTER — Other Ambulatory Visit: Payer: Self-pay

## 2023-09-30 ENCOUNTER — Emergency Department (HOSPITAL_COMMUNITY)
Admission: EM | Admit: 2023-09-30 | Discharge: 2023-09-30 | Disposition: A | Discharge status: Home / Self Care | Payer: No Typology Code available for payment source | Attending: Emergency Medicine | Admitting: Emergency Medicine

## 2023-09-30 DIAGNOSIS — M542 Cervicalgia: Secondary | ICD-10-CM | POA: Insufficient documentation

## 2023-09-30 DIAGNOSIS — S0181XA Laceration without foreign body of other part of head, initial encounter: Secondary | ICD-10-CM | POA: Diagnosis present

## 2023-09-30 DIAGNOSIS — Z23 Encounter for immunization: Secondary | ICD-10-CM | POA: Diagnosis not present

## 2023-09-30 DIAGNOSIS — M25562 Pain in left knee: Secondary | ICD-10-CM | POA: Insufficient documentation

## 2023-09-30 DIAGNOSIS — Y92410 Unspecified street and highway as the place of occurrence of the external cause: Secondary | ICD-10-CM | POA: Insufficient documentation

## 2023-09-30 DIAGNOSIS — T1490XA Injury, unspecified, initial encounter: Secondary | ICD-10-CM

## 2023-09-30 LAB — CBC WITH DIFFERENTIAL/PLATELET
Abs Immature Granulocytes: 0.03 10*3/uL (ref 0.00–0.07)
Basophils Absolute: 0 10*3/uL (ref 0.0–0.1)
Basophils Relative: 0 %
Eosinophils Absolute: 0.1 10*3/uL (ref 0.0–0.5)
Eosinophils Relative: 1 %
HCT: 40.3 % (ref 39.0–52.0)
Hemoglobin: 13.2 g/dL (ref 13.0–17.0)
Immature Granulocytes: 0 %
Lymphocytes Relative: 26 %
Lymphs Abs: 1.9 10*3/uL (ref 0.7–4.0)
MCH: 28 pg (ref 26.0–34.0)
MCHC: 32.8 g/dL (ref 30.0–36.0)
MCV: 85.6 fL (ref 80.0–100.0)
Monocytes Absolute: 0.6 10*3/uL (ref 0.1–1.0)
Monocytes Relative: 9 %
Neutro Abs: 4.6 10*3/uL (ref 1.7–7.7)
Neutrophils Relative %: 64 %
Platelets: 315 10*3/uL (ref 150–400)
RBC: 4.71 MIL/uL (ref 4.22–5.81)
RDW: 13.8 % (ref 11.5–15.5)
WBC: 7.3 10*3/uL (ref 4.0–10.5)
nRBC: 0 % (ref 0.0–0.2)

## 2023-09-30 LAB — I-STAT CHEM 8, ED
BUN: 14 mg/dL (ref 6–20)
Calcium, Ion: 1.14 mmol/L — ABNORMAL LOW (ref 1.15–1.40)
Chloride: 104 mmol/L (ref 98–111)
Creatinine, Ser: 1.1 mg/dL (ref 0.61–1.24)
Glucose, Bld: 85 mg/dL (ref 70–99)
HCT: 41 % (ref 39.0–52.0)
Hemoglobin: 13.9 g/dL (ref 13.0–17.0)
Potassium: 3.7 mmol/L (ref 3.5–5.1)
Sodium: 140 mmol/L (ref 135–145)
TCO2: 27 mmol/L (ref 22–32)

## 2023-09-30 MED ORDER — MORPHINE SULFATE (PF) 4 MG/ML IV SOLN
4.0000 mg | Freq: Once | INTRAVENOUS | Status: AC
  Administered 2023-09-30: 4 mg via INTRAVENOUS
  Filled 2023-09-30: qty 1

## 2023-09-30 MED ORDER — IOHEXOL 350 MG/ML SOLN
75.0000 mL | Freq: Once | INTRAVENOUS | Status: AC | PRN
  Administered 2023-09-30: 75 mL via INTRAVENOUS

## 2023-09-30 MED ORDER — TETANUS-DIPHTH-ACELL PERTUSSIS 5-2.5-18.5 LF-MCG/0.5 IM SUSY
0.5000 mL | PREFILLED_SYRINGE | Freq: Once | INTRAMUSCULAR | Status: AC
  Administered 2023-09-30: 0.5 mL via INTRAMUSCULAR
  Filled 2023-09-30: qty 0.5

## 2023-09-30 MED ORDER — FENTANYL CITRATE PF 50 MCG/ML IJ SOSY
50.0000 ug | PREFILLED_SYRINGE | Freq: Once | INTRAMUSCULAR | Status: DC
  Filled 2023-09-30: qty 1

## 2023-09-30 MED ORDER — METHOCARBAMOL 500 MG PO TABS
500.0000 mg | ORAL_TABLET | Freq: Two times a day (BID) | ORAL | 0 refills | Status: AC
Start: 2023-09-30 — End: ?

## 2023-09-30 MED ORDER — ONDANSETRON HCL 4 MG/2ML IJ SOLN
4.0000 mg | Freq: Once | INTRAMUSCULAR | Status: AC
  Administered 2023-09-30: 4 mg via INTRAVENOUS
  Filled 2023-09-30: qty 2

## 2023-09-30 MED ORDER — ACETAMINOPHEN 500 MG PO TABS
1000.0000 mg | ORAL_TABLET | Freq: Once | ORAL | Status: AC
  Administered 2023-09-30: 1000 mg via ORAL
  Filled 2023-09-30: qty 2
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
## 2023-09-30 NOTE — ED Notes (Signed)
To CT via stretcher with RN on monitor, c-collar remains, pt flat supine

## 2023-09-30 NOTE — ED Notes (Signed)
Patient transported to CT 

## 2023-09-30 NOTE — ED Notes (Signed)
Difficult establishing IV access and lab draw, multiple attempts

## 2023-09-30 NOTE — ED Notes (Signed)
Xray at [facility], pt remains alert, NAD, calm, interactive, cooperative.
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
## 2024-06-21 ENCOUNTER — Emergency Department (HOSPITAL_COMMUNITY)
Admission: EM | Admit: 2024-06-21 | Discharge: 2024-06-21 | Disposition: A | Discharge status: Home / Self Care | Attending: Emergency Medicine | Admitting: Emergency Medicine

## 2024-06-21 ENCOUNTER — Other Ambulatory Visit: Payer: Self-pay

## 2024-06-21 DIAGNOSIS — I10 Essential (primary) hypertension: Secondary | ICD-10-CM | POA: Diagnosis not present

## 2024-06-21 DIAGNOSIS — E162 Hypoglycemia, unspecified: Secondary | ICD-10-CM | POA: Insufficient documentation

## 2024-06-21 DIAGNOSIS — R110 Nausea: Secondary | ICD-10-CM | POA: Diagnosis present

## 2024-06-21 LAB — CBC
HCT: 38.9 % — ABNORMAL LOW (ref 39.0–52.0)
Hemoglobin: 12.6 g/dL — ABNORMAL LOW (ref 13.0–17.0)
MCH: 27.5 pg (ref 26.0–34.0)
MCHC: 32.4 g/dL (ref 30.0–36.0)
MCV: 84.7 fL (ref 80.0–100.0)
Platelets: 315 K/uL (ref 150–400)
RBC: 4.59 MIL/uL (ref 4.22–5.81)
RDW: 13.4 % (ref 11.5–15.5)
WBC: 5.9 K/uL (ref 4.0–10.5)
nRBC: 0 % (ref 0.0–0.2)

## 2024-06-21 LAB — URINALYSIS, ROUTINE W REFLEX MICROSCOPIC
Bilirubin Urine: NEGATIVE
Glucose, UA: NEGATIVE mg/dL
Hgb urine dipstick: NEGATIVE
Ketones, ur: 80 mg/dL — AB
Leukocytes,Ua: NEGATIVE
Nitrite: NEGATIVE
Protein, ur: NEGATIVE mg/dL
Specific Gravity, Urine: 1.029 (ref 1.005–1.030)
pH: 5 (ref 5.0–8.0)

## 2024-06-21 LAB — COMPREHENSIVE METABOLIC PANEL WITH GFR
ALT: 28 U/L (ref 0–44)
AST: 32 U/L (ref 15–41)
Albumin: 4.7 g/dL (ref 3.5–5.0)
Alkaline Phosphatase: 54 U/L (ref 38–126)
Anion gap: 17 — ABNORMAL HIGH (ref 5–15)
BUN: 12 mg/dL (ref 6–20)
CO2: 21 mmol/L — ABNORMAL LOW (ref 22–32)
Calcium: 9.9 mg/dL (ref 8.9–10.3)
Chloride: 101 mmol/L (ref 98–111)
Creatinine, Ser: 0.95 mg/dL (ref 0.61–1.24)
GFR, Estimated: >60 mL/min (ref >60)
Glucose, Bld: 66 mg/dL — ABNORMAL LOW (ref 70–99)
Potassium: 3.5 mmol/L (ref 3.5–5.1)
Sodium: 139 mmol/L (ref 135–145)
Total Bilirubin: 0.5 mg/dL (ref 0.0–1.2)
Total Protein: 7.2 g/dL (ref 6.5–8.1)

## 2024-06-21 LAB — BASIC METABOLIC PANEL WITH GFR
Anion gap: 12 (ref 5–15)
Anion gap: 15 (ref 5–15)
BUN: 9 mg/dL (ref 6–20)
BUN: 12 mg/dL (ref 6–20)
CO2: 17 mmol/L — ABNORMAL LOW (ref 22–32)
CO2: 21 mmol/L — ABNORMAL LOW (ref 22–32)
Calcium: 6.6 mg/dL — ABNORMAL LOW (ref 8.9–10.3)
Calcium: 9.3 mg/dL (ref 8.9–10.3)
Chloride: 114 mmol/L — ABNORMAL HIGH (ref 98–111)
Chloride: 102 mmol/L (ref 98–111)
Creatinine, Ser: 0.61 mg/dL (ref 0.61–1.24)
Creatinine, Ser: 0.85 mg/dL (ref 0.61–1.24)
GFR, Estimated: >60 mL/min (ref >60)
GFR, Estimated: >60 mL/min (ref >60)
Glucose, Bld: 48 mg/dL — ABNORMAL LOW (ref 70–99)
Glucose, Bld: 60 mg/dL — ABNORMAL LOW (ref 70–99)
Potassium: 2.4 mmol/L — CL (ref 3.5–5.1)
Potassium: 3.6 mmol/L (ref 3.5–5.1)
Sodium: 143 mmol/L (ref 135–145)
Sodium: 139 mmol/L (ref 135–145)

## 2024-06-21 LAB — CBG MONITORING, ED
Glucose-Capillary: 61 mg/dL — ABNORMAL LOW (ref 70–99)
Glucose-Capillary: 52 mg/dL — ABNORMAL LOW (ref 70–99)
Glucose-Capillary: 81 mg/dL (ref 70–99)
Glucose-Capillary: 63 mg/dL — ABNORMAL LOW (ref 70–99)

## 2024-06-21 LAB — LIPASE, BLOOD: Lipase: 33 U/L (ref 11–51)

## 2024-06-21 MED ORDER — ONDANSETRON HCL 4 MG/2ML IJ SOLN: 4.0000 mg | Freq: Once | INTRAMUSCULAR | Status: DC | PRN

## 2024-06-21 MED ORDER — DEXTROSE 50 % IV SOLN
25.0000 mL | Freq: Once | INTRAVENOUS | Status: AC
  Administered 2024-06-21: 25 mL via INTRAVENOUS
  Filled 2024-06-21: qty 50

## 2024-06-21 MED ORDER — SODIUM CHLORIDE 0.9 % IV BOLUS
1000.0000 mL | Freq: Once | INTRAVENOUS | Status: AC
  Administered 2024-06-21: 1000 mL via INTRAVENOUS

## 2024-06-21 MED ORDER — PROCHLORPERAZINE EDISYLATE 10 MG/2ML IJ SOLN
10.0000 mg | Freq: Once | INTRAMUSCULAR | Status: DC
  Filled 2024-06-21: qty 2

## 2024-06-21 MED ORDER — DEXTROSE 10 % IV SOLN: INTRAVENOUS | Status: DC

## 2024-06-21 NOTE — ED Notes (Signed)
 Pt was given a [place] sandwich, ice water, and apple juice.

## 2024-06-21 NOTE — ED Provider Notes (Signed)
  [place] EMERGENCY DEPARTMENT AT [facility] Provider Assume Care Note I assumed care of Jacob Contreras on 06/21/2024 at 4:30 PM from Dr. Francesca.   Briefly, Jacob Contreras is a 33 y.o. 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  Had hypoglycemia.  Had symptomatic improvement after fluids and D50  Plan at the time of handoff: Monitor, repeat blood sugar   Please refer to the original provider's note for additional information regarding the care of [employer].  Reassessment: I personally reassessed the patient: Patient without any acute complaints or additional questions.  Vital Signs:  ED Triage Vitals  Encounter Vitals Group     BP 06/21/24 1350 132/74     Girls Systolic BP Percentile --      Girls Diastolic BP Percentile --      Boys Systolic BP Percentile --      Boys Diastolic BP Percentile --      Pulse Rate 06/21/24 1350 70     Resp 06/21/24 1350 16     Temp 06/21/24 1353 98.6 F (37 C)     Temp Source 06/21/24 1353 Oral     SpO2 06/21/24 1350 99 %     Weight --      Height --      Head Circumference --      Peak Flow --      Pain Score --      Pain Loc --      Pain Education --      Exclude from Growth Chart --      Hemodynamics:  The patient is hemodynamically stable. Mental Status:  The patient is alert  Additional MDM: Patient reassessed, patient declined Compazine  as he is unfamiliar with the medication.  On reassessment patient was alert, interactive.  Given initial CMP had anion gap elevation as well as hyperglycemia, replete BMP obtained.  This demonstrated multiple abnormalities that were not consistent with patient's presentation including significant drop in potassium to 2.4.  Also redemonstrated hypoglycemia.  Provided snacks, and repeat BMP sent demonstrating normalization of all values with  exception of persistent hypoglycemia.  Discussed labs with patient at bedside, discussed hypoglycemia risk in lead to seizure, death, permanent disability.  Patient expressed understanding, able to verbalize the risks and benefits of treatment, and would like to sign out AMA.  Discussed that he should return to the emergency department ASAP for further workup.  Disposition: Discharge AMA  FREDRIK CANDIE Later, MD Emergency Medicine    Later Jerilynn RAMAN, MD 06/21/24 2028

## 2024-06-21 NOTE — ED Provider Notes (Signed)
 [place] [facility] DEPARTMENT AT [facility] Provider Note  CSN: [id number] Arrival date & time: 06/21/24 1341  Chief Complaint(s) Nausea and Dizziness  HPI Jacob Contreras is a 33 y.o. male history of bipolar disorder presenting to the emergency department for nausea, feeling weak.  Patient reports that today he was at school cafeteria and someone was making chicken sausage.  He reports that he ate a bite and then developed weakness and nausea.  He is concerned that he was somehow poisoned.  He denies any vomiting.  Denies any chest pain, reported some chest tightness in triage.  Reports some mild generalized abdominal pain but no focal pain.  No urinary symptoms or back pain.  No shortness of breath.  Denies any ingestion   Past Medical History Past Medical History:  Diagnosis Date   Hypertension    Patient Active Problem List   Diagnosis Date Noted   Bipolar 2 disorder, major depressive episode (HCC) 12/19/2022   Anxiety state 12/19/2022   Insomnia 12/19/2022   Suicide attempt (HCC) 12/17/2022   Grief 12/17/2022   Overdose 12/17/2022   Home Medication(s) Prior to Admission medications   Medication Sig Start Date End Date Taking? Authorizing Provider  methocarbamol  (ROBAXIN ) 500 MG tablet Take 1 tablet (500 mg total) by mouth 2 (two) times daily. 09/30/23   Jerral Meth, MD                                                                                                                                    Past Surgical History No past surgical history on file. Family History No family history on file.  Social History Social History   Tobacco Use   Smoking status: Never   Smokeless tobacco: Never  Substance Use Topics   Alcohol use: Never   Drug use: Never   Allergies Mushroom extract complex (obsolete) and Trazodone and nefazodone  Review of Systems Review of Systems  All other systems reviewed and are negative.   Physical Exam Vital Signs  I have  reviewed the triage vital signs BP (!) 164/89   Pulse 73   Temp 98.6 F (37 C) (Oral)   Resp 18   SpO2 100%  Physical Exam Vitals and nursing note reviewed.  Constitutional:      General: He is not in acute distress.    Appearance: Normal appearance.  HENT:     Mouth/Throat:     Mouth: Mucous membranes are dry.  Eyes:     Conjunctiva/sclera: Conjunctivae normal.  Cardiovascular:     Rate and Rhythm: Normal rate and regular rhythm.  Pulmonary:     Effort: Pulmonary effort is normal. No respiratory distress.     Breath sounds: Normal breath sounds.  Abdominal:     General: Abdomen is flat.     Palpations: Abdomen is soft.     Tenderness: There is no abdominal tenderness.  Musculoskeletal:     Right  lower leg: No edema.     Left lower leg: No edema.  Skin:    General: Skin is warm and dry.     Capillary Refill: Capillary refill takes less than 2 seconds.  Neurological:     Mental Status: He is alert and oriented to person, place, and time. Mental status is at baseline.  Psychiatric:        Mood and Affect: Mood normal.        Behavior: Behavior normal.     ED Results and Treatments Labs (all labs ordered are listed, but only abnormal results are displayed) Labs Reviewed  COMPREHENSIVE METABOLIC PANEL WITH GFR - Abnormal; Notable for the following components:      Result Value   CO2 21 (*)    Glucose, Bld 66 (*)    Anion gap 17 (*)    All other components within normal limits  CBC - Abnormal; Notable for the following components:   Hemoglobin 12.6 (*)    HCT 38.9 (*)    All other components within normal limits  CBG MONITORING, ED - Abnormal; Notable for the following components:   Glucose-Capillary 61 (*)    All other components within normal limits  CBG MONITORING, ED - Abnormal; Notable for the following components:   Glucose-Capillary 52 (*)    All other components within normal limits  LIPASE, BLOOD  URINALYSIS, ROUTINE W REFLEX MICROSCOPIC  CBG  MONITORING, ED                                                                                                                          Radiology No results found.  Pertinent labs & imaging results that were available during my care of the patient were reviewed by me and considered in my medical decision making (see MDM for details).  Medications Ordered in ED Medications  ondansetron  (ZOFRAN ) injection 4 mg (has no administration in time range)  prochlorperazine  (COMPAZINE ) injection 10 mg (10 mg Intravenous Patient Refused/Not Given 06/21/24 1536)  sodium chloride  0.9 % bolus 1,000 mL (1,000 mLs Intravenous New Bag/Given 06/21/24 1536)  dextrose  50 % solution 25 mL (25 mLs Intravenous Given 06/21/24 1536)                                                                                                                                     Procedures Procedures  (including critical care  time)  Medical Decision Making / ED Course   MDM:  33 year old presenting to the emergency department with nausea and weakness.  Patient overall well-appearing, physical examination with no focal abnormality.  Appears mildly dehydrated.  No abdominal tenderness.  Potentially could be some type of foodborne illness given occurred after eating.  Patient denies any diarrhea.  Unusually initial labs with mild hypoglycemia, on recheck of CBG was 52, patient received fluids and amp of D50.  On recheck it is improved.  Will need further observation.  No focal abdominal tenderness to suggest any acute intra-abdominal process like pancreatitis, cholecystitis, perforation, obstruction.  Labs also with mild anion gap, patient have some mild ketosis.  Denies any insulin use or diabetes .  Given hypoglycemia event, patient will need to be monitored.  Signed out to oncoming attending Dr. Rogelia pending reassessment.   Clinical Course as of 06/21/24 1633  Thu Jun 21, 2024  1625 Reasses and repeat glucose Was in [employer], cooking sausage, vomited? Concerned for food poisoning? Nausea and fatigue, better after fluids, recheck sugar, check for nausea, go home  [LS]    Clinical Course User Index [LS] Rogelia Jerilynn RAMAN, MD     Additional history obtained: -Additional history obtained from ems -External records from outside source obtained and reviewed including: Chart review including previous notes, labs, imaging, consultation notes including prior notes    Lab Tests: -I ordered, reviewed, and interpreted labs.   The pertinent results include:   Labs Reviewed  COMPREHENSIVE METABOLIC PANEL WITH GFR - Abnormal; Notable for the following components:      Result Value   CO2 21 (*)    Glucose, Bld 66 (*)    Anion gap 17 (*)    All other components within normal limits  CBC - Abnormal; Notable for the following components:   Hemoglobin 12.6 (*)    HCT 38.9 (*)    All other components within normal limits  CBG MONITORING, ED - Abnormal; Notable for the following components:   Glucose-Capillary 61 (*)    All other components within normal limits  CBG MONITORING, ED - Abnormal; Notable for the following components:   Glucose-Capillary 52 (*)    All other components within normal limits  LIPASE, BLOOD  URINALYSIS, ROUTINE W REFLEX MICROSCOPIC  CBG MONITORING, ED    Notable for hypoglycemia    Medicines ordered and prescription drug management: Meds ordered this encounter  Medications   ondansetron  (ZOFRAN ) injection 4 mg   sodium chloride  0.9 % bolus 1,000 mL   dextrose  50 % solution 25 mL   prochlorperazine  (COMPAZINE ) injection 10 mg    -I have reviewed the patients home medicines and have made adjustments as needed  Reevaluation: After the interventions noted above, I reevaluated the patient and found that their symptoms have improved  Co morbidities that complicate the patient evaluation  Past Medical History:  Diagnosis Date   Hypertension       Dispostion: Disposition  decision including need for hospitalization was considered, and patient disposition pending at time of sign out.    Final Clinical Impression(s) / ED Diagnoses Final diagnoses:  Hypoglycemia     This chart was dictated using voice recognition software.  Despite best efforts to proofread,  errors can occur which can change the documentation meaning.    Francesca Elsie CROME, MD 06/21/24 [phone]

## 2024-06-21 NOTE — Discharge Instructions (Signed)
 Jacob Contreras  Thank you for allowing us  to take care of you today.  You came to the Emergency Department today because you felt poorly after eating a chicken sausage earlier today.  Here in the emergency department your blood sugar was fairly low.  We gave you sugar fluids, and rechecked your labs.  Your first set of labs had a lab error that showed multiple abnormalities including low sugar, however it did not match your symptoms, so we repeated it and the rest of your labs were normal, however your sugar is still low.  Scheving sugar this level is not normal, particularly for someone who is otherwise young and healthy and not on diabetes medications.  We strongly recommend staying in the hospital to stay on sugar through the IV, and get further workup.  Low blood sugar can lead to seizures, heart stopping, permanent disability, death.  We discussed these risks and you would like to go home.  We recommend immediately returning to the hospital for further workup and admission, and we particularly encourage you to return ASAP and call 911 if you develop any new or worsening symptoms.  To-Do: 1. Please return to the emergency department ASAP  Please return to the Emergency Department or call 911 if you experience have worsening of your symptoms, or do not get better, chest pain, shortness of breath, severe or significantly worsening pain, high fever, severe confusion, pass out or have any reason to think that you need emergency medical care.   We hope you feel better soon.   Mitzie Later, MD Department of Emergency Medicine [facility]

## 2024-06-21 NOTE — ED Triage Notes (Signed)
 BIBA from school for possible food poisoning- pt has c/o dizziness, nausea, chest tightness. 72 cbg
